# Patient Record
Sex: Male | Born: 1980 | Race: White | Hispanic: No | Smoking: Current every day smoker
Health system: Southern US, Community
[De-identification: ages and names within clinical notes are randomized; demographics above are authoritative.]

## PROBLEM LIST (undated history)

## (undated) DIAGNOSIS — I10 Essential (primary) hypertension: Secondary | ICD-10-CM

## (undated) DIAGNOSIS — E119 Type 2 diabetes mellitus without complications: Secondary | ICD-10-CM

## (undated) DIAGNOSIS — K769 Liver disease, unspecified: Secondary | ICD-10-CM

---

## 2006-10-10 ENCOUNTER — Emergency Department (HOSPITAL_COMMUNITY): Admission: EM | Admit: 2006-10-10 | Discharge: 2006-10-10 | Payer: Self-pay | Admitting: Emergency Medicine

## 2007-01-06 ENCOUNTER — Emergency Department (HOSPITAL_COMMUNITY): Admission: EM | Admit: 2007-01-06 | Discharge: 2007-01-06 | Payer: Self-pay | Admitting: Emergency Medicine

## 2007-06-21 ENCOUNTER — Emergency Department (HOSPITAL_COMMUNITY): Admission: EM | Admit: 2007-06-21 | Discharge: 2007-06-21 | Payer: Self-pay | Admitting: Emergency Medicine

## 2014-11-10 ENCOUNTER — Emergency Department: Payer: Self-pay | Admitting: Emergency Medicine

## 2020-07-22 ENCOUNTER — Ambulatory Visit: Payer: Medicaid Other | Attending: Internal Medicine

## 2020-07-22 DIAGNOSIS — Z23 Encounter for immunization: Secondary | ICD-10-CM

## 2020-07-22 NOTE — Progress Notes (Signed)
   Covid-19 Vaccination Clinic  Name:  Stephen Rangel    MRN: [id number] DOB: 05/20/1981  07/22/2020  [name] was observed post Covid-19 immunization for 15 minutes without incident. He was provided with Vaccine Information Sheet and instruction to access the V-Safe system.   [name] was instructed to call 911 with any severe reactions post vaccine: Marland Kitchen Difficulty breathing  . Swelling of face and throat  . A fast heartbeat  . A bad rash all over body  . Dizziness and weakness   Immunizations Administered    Name Date Dose VIS Date Route   Pfizer COVID-19 Vaccine 07/22/2020 10:29 AM 0.3 mL 01/31/2019 Intramuscular   Manufacturer: [employer], [employer]   Lot: [id number]   NDC: [address]-4

## 2020-08-19 ENCOUNTER — Ambulatory Visit: Payer: Self-pay

## 2021-03-02 ENCOUNTER — Other Ambulatory Visit: Payer: Self-pay

## 2021-03-02 ENCOUNTER — Emergency Department
Admission: EM | Admit: 2021-03-02 | Discharge: 2021-03-02 | Disposition: A | Discharge status: Home / Self Care | Payer: Medicaid Other | Attending: Emergency Medicine | Admitting: Emergency Medicine

## 2021-03-02 ENCOUNTER — Encounter: Payer: Self-pay | Admitting: Emergency Medicine

## 2021-03-02 DIAGNOSIS — X58XXXA Exposure to other specified factors, initial encounter: Secondary | ICD-10-CM | POA: Insufficient documentation

## 2021-03-02 DIAGNOSIS — S025XXA Fracture of tooth (traumatic), initial encounter for closed fracture: Secondary | ICD-10-CM | POA: Insufficient documentation

## 2021-03-02 DIAGNOSIS — K047 Periapical abscess without sinus: Secondary | ICD-10-CM

## 2021-03-02 DIAGNOSIS — E119 Type 2 diabetes mellitus without complications: Secondary | ICD-10-CM | POA: Insufficient documentation

## 2021-03-02 DIAGNOSIS — S0993XA Unspecified injury of face, initial encounter: Secondary | ICD-10-CM | POA: Diagnosis present

## 2021-03-02 DIAGNOSIS — K0889 Other specified disorders of teeth and supporting structures: Secondary | ICD-10-CM

## 2021-03-02 HISTORY — DX: Type 2 diabetes mellitus without complications: E11.9

## 2021-03-02 HISTORY — DX: Liver disease, unspecified: K76.9

## 2021-03-02 MED ORDER — AMOXICILLIN-POT CLAVULANATE 875-125 MG PO TABS: 1.0000 | ORAL_TABLET | Freq: Two times a day (BID) | ORAL | 0 refills | Status: AC

## 2021-03-02 MED ORDER — HYDROCODONE-ACETAMINOPHEN 5-325 MG PO TABS: 1.0000 | ORAL_TABLET | ORAL | 0 refills | Status: DC | PRN

## 2021-03-02 MED ORDER — AMOXICILLIN-POT CLAVULANATE 875-125 MG PO TABS
1.0000 | ORAL_TABLET | Freq: Once | ORAL | Status: AC
  Administered 2021-03-02: 1 via ORAL
  Filled 2021-03-02: qty 1

## 2021-03-02 MED ORDER — HYDROCODONE-ACETAMINOPHEN 5-325 MG PO TABS
1.0000 | ORAL_TABLET | ORAL | Status: AC
  Administered 2021-03-02: 1 via ORAL
  Filled 2021-03-02: qty 1

## 2021-03-02 NOTE — Discharge Instructions (Addendum)
Please take antibiotics as prescribed and follow-up with dental clinic Monday.  Return to the ER for any fevers difficulty swallowing worsening symptoms or urgent changes in your health.

## 2021-03-02 NOTE — ED Triage Notes (Signed)
Pt reports toothache to right lower back jaw for the past year worsening over the last few days

## 2021-03-02 NOTE — ED Provider Notes (Signed)
[facility] EMERGENCY DEPARTMENT Provider Note   CSN: [id number] Arrival date & time: 03/02/21  1008     History Chief Complaint  Patient presents with  . Dental Pain    Stephen Rangel is a 40 y.o. male presents to the emergency department for evaluation of right upper and lower dental pain.  For 1 year he has had 2 broken teeth, his right upper third molar and his right lower second molar.  He has had intermittent infections, most recent infection started several days ago.  He has pain to touch with eating, denies any fevers.  When he breathes then, air will aggravate both of these teeth.  Pain is moderate to severe, no relief with BC powder.  He is not on any current antibiotics.  Patient states he is going to see a dentist Monday for extraction. HPI     Past Medical History:  Diagnosis Date  . Diabetes mellitus without complication (HCC)   . Liver disease     There are no problems to display for this patient.   History reviewed. No pertinent surgical history.     No family history on file.     Home Medications Prior to Admission medications   Medication Sig Start Date End Date Taking? Authorizing Provider  amoxicillin-clavulanate (AUGMENTIN) 875-125 MG tablet Take 1 tablet by mouth every 12 (twelve) hours for 7 days. 03/02/21 03/09/21 Yes Evon Slack, PA-C  HYDROcodone-acetaminophen (NORCO) 5-325 MG tablet Take 1 tablet by mouth every 4 (four) hours as needed for moderate pain. 03/02/21  Yes Evon Slack, PA-C    Allergies    Patient has no allergy information on record.  Review of Systems   Review of Systems  Constitutional: Negative.  Negative for chills and fever.  HENT: Positive for dental problem. Negative for drooling, facial swelling, mouth sores, trouble swallowing and voice change.   Respiratory: Negative for shortness of breath.   Cardiovascular: Negative for chest pain.  Gastrointestinal: Negative for nausea and vomiting.   Musculoskeletal: Negative for arthralgias, neck pain and neck stiffness.  Skin: Negative.   All other systems reviewed and are negative.   Physical Exam Updated Vital Signs BP 136/74 (BP Location: Left Arm)   Pulse 63   Temp 98.6 F (37 C) (Oral)   Resp 16   Ht 5\' 11"  (1.803 m)   Wt 95.7 kg   SpO2 98%   BMI 29.43 kg/m   Physical Exam Constitutional:      General: He is not in acute distress.    Appearance: He is well-developed.  HENT:     Head: Normocephalic and atraumatic.     Jaw: No trismus.     Comments: Right upper third molar and right lower second molar cracked, decayed with tenderness.  No signs of fluctuance or surrounding abscess formation.  No external soft tissue swelling.  No trismus.    Right Ear: External ear normal.     Left Ear: External ear normal.     Nose: Nose normal.     Mouth/Throat:     Mouth: No oral lesions.     Dentition: Normal dentition.     Pharynx: Uvula midline. No oropharyngeal exudate, posterior oropharyngeal erythema or uvula swelling.  Eyes:     Conjunctiva/sclera: Conjunctivae normal.  Cardiovascular:     Rate and Rhythm: Normal rate.     Heart sounds: No murmur heard. No friction rub. No gallop.   Pulmonary:     Effort: Pulmonary effort  is normal. No respiratory distress.     Breath sounds: Normal breath sounds.  Musculoskeletal:     Cervical back: Normal range of motion and neck supple.  Skin:    General: Skin is warm and dry.  Neurological:     General: No focal deficit present.     Mental Status: He is alert and oriented to person, place, and time.  Psychiatric:        Behavior: Behavior normal.        Thought Content: Thought content normal.     ED Results / Procedures / Treatments   Labs (all labs ordered are listed, but only abnormal results are displayed) Labs Reviewed - No data to display  EKG None  Radiology No results found.  Procedures Procedures   Medications Ordered in ED Medications   amoxicillin-clavulanate (AUGMENTIN) 875-125 MG per tablet 1 tablet (has no administration in time range)  HYDROcodone-acetaminophen (NORCO/VICODIN) 5-325 MG per tablet 1 tablet (has no administration in time range)    ED Course  I have reviewed the triage vital signs and the nursing notes.  Pertinent labs & imaging results that were available during my care of the patient were reviewed by me and considered in my medical decision making (see chart for details).    MDM Rules/Calculators/A&P                          40 year old male with upper and lower dental pain secondary to cracked teeth with underlying dental infection.  He is started on oral antibiotic and given Norco for pain.  He understands signs and symptoms return to the ER for.  He states he has an appointment Monday with surgeon to discuss extraction. Final Clinical Impression(s) / ED Diagnoses Final diagnoses:  Pain, dental  Closed fracture of tooth, initial encounter  Dental infection    Rx / DC Orders ED Discharge Orders         Ordered    amoxicillin-clavulanate (AUGMENTIN) 875-125 MG tablet  Every 12 hours        03/02/21 1035    HYDROcodone-acetaminophen (NORCO) 5-325 MG tablet  Every 4 hours PRN        03/02/21 1035           Ronnette Juniper 03/02/21 1039    Sharyn Creamer, MD 03/03/21 1307

## 2021-03-03 ENCOUNTER — Other Ambulatory Visit: Payer: Self-pay

## 2021-03-03 DIAGNOSIS — K0889 Other specified disorders of teeth and supporting structures: Secondary | ICD-10-CM | POA: Diagnosis present

## 2021-03-03 DIAGNOSIS — Z5321 Procedure and treatment not carried out due to patient leaving prior to being seen by health care provider: Secondary | ICD-10-CM | POA: Diagnosis not present

## 2021-03-03 NOTE — ED Triage Notes (Signed)
Patient ambulatory to triage with steady gait, without difficulty or distress noted; st seen recently for dental abscess; c/o persistent rt sided dental pain

## 2021-03-04 ENCOUNTER — Encounter: Payer: Self-pay | Admitting: Emergency Medicine

## 2021-03-04 ENCOUNTER — Emergency Department
Admission: EM | Admit: 2021-03-04 | Discharge: 2021-03-04 | Disposition: A | Discharge status: Home / Self Care | Payer: Medicaid Other | Attending: Emergency Medicine | Admitting: Emergency Medicine

## 2021-03-04 ENCOUNTER — Other Ambulatory Visit: Payer: Self-pay

## 2021-04-05 ENCOUNTER — Emergency Department
Admission: EM | Admit: 2021-04-05 | Discharge: 2021-04-05 | Discharge status: Against Medical Advice | Payer: Medicaid Other | Attending: Emergency Medicine | Admitting: Emergency Medicine

## 2021-04-05 ENCOUNTER — Other Ambulatory Visit: Payer: Self-pay

## 2021-04-05 ENCOUNTER — Emergency Department: Payer: Medicaid Other

## 2021-04-05 DIAGNOSIS — J168 Pneumonia due to other specified infectious organisms: Secondary | ICD-10-CM | POA: Diagnosis not present

## 2021-04-05 DIAGNOSIS — R0602 Shortness of breath: Secondary | ICD-10-CM | POA: Diagnosis present

## 2021-04-05 DIAGNOSIS — Z20822 Contact with and (suspected) exposure to covid-19: Secondary | ICD-10-CM | POA: Diagnosis not present

## 2021-04-05 DIAGNOSIS — E119 Type 2 diabetes mellitus without complications: Secondary | ICD-10-CM | POA: Insufficient documentation

## 2021-04-05 DIAGNOSIS — J45901 Unspecified asthma with (acute) exacerbation: Secondary | ICD-10-CM

## 2021-04-05 DIAGNOSIS — J40 Bronchitis, not specified as acute or chronic: Secondary | ICD-10-CM | POA: Insufficient documentation

## 2021-04-05 DIAGNOSIS — R062 Wheezing: Secondary | ICD-10-CM | POA: Diagnosis not present

## 2021-04-05 DIAGNOSIS — F17200 Nicotine dependence, unspecified, uncomplicated: Secondary | ICD-10-CM | POA: Insufficient documentation

## 2021-04-05 DIAGNOSIS — J189 Pneumonia, unspecified organism: Secondary | ICD-10-CM

## 2021-04-05 LAB — COMPREHENSIVE METABOLIC PANEL
ALT: 41 U/L (ref 0–44)
AST: 37 U/L (ref 15–41)
Albumin: 3.6 g/dL (ref 3.5–5.0)
Alkaline Phosphatase: 83 U/L (ref 38–126)
Anion gap: 9 (ref 5–15)
BUN: 16 mg/dL (ref 6–20)
CO2: 25 mmol/L (ref 22–32)
Calcium: 8.4 mg/dL — ABNORMAL LOW (ref 8.9–10.3)
Chloride: 101 mmol/L (ref 98–111)
Creatinine, Ser: 0.71 mg/dL (ref 0.61–1.24)
GFR, Estimated: >60 mL/min (ref >60)
Glucose, Bld: 129 mg/dL — ABNORMAL HIGH (ref 70–99)
Potassium: 4.4 mmol/L (ref 3.5–5.1)
Sodium: 135 mmol/L (ref 135–145)
Total Bilirubin: 0.6 mg/dL (ref 0.3–1.2)
Total Protein: 7.5 g/dL (ref 6.5–8.1)

## 2021-04-05 LAB — CBC WITH DIFFERENTIAL/PLATELET
Abs Immature Granulocytes: 0.02 10*3/uL (ref 0.00–0.07)
Basophils Absolute: 0 10*3/uL (ref 0.0–0.1)
Basophils Relative: 0 %
Eosinophils Absolute: 0.1 10*3/uL (ref 0.0–0.5)
Eosinophils Relative: 1 %
HCT: 40.2 % (ref 39.0–52.0)
Hemoglobin: 13 g/dL (ref 13.0–17.0)
Immature Granulocytes: 0 %
Lymphocytes Relative: 17 %
Lymphs Abs: 1.7 10*3/uL (ref 0.7–4.0)
MCH: 27.9 pg (ref 26.0–34.0)
MCHC: 32.3 g/dL (ref 30.0–36.0)
MCV: 86.3 fL (ref 80.0–100.0)
Monocytes Absolute: 0.8 10*3/uL (ref 0.1–1.0)
Monocytes Relative: 8 %
Neutro Abs: 7.2 10*3/uL (ref 1.7–7.7)
Neutrophils Relative %: 74 %
Platelets: 156 10*3/uL (ref 150–400)
RBC: 4.66 MIL/uL (ref 4.22–5.81)
RDW: 14.2 % (ref 11.5–15.5)
WBC: 9.9 10*3/uL (ref 4.0–10.5)
nRBC: 0 % (ref 0.0–0.2)

## 2021-04-05 LAB — PROCALCITONIN: Procalcitonin: <0.1 ng/mL

## 2021-04-05 LAB — RESP PANEL BY RT-PCR (FLU A&B, COVID) ARPGX2
Influenza A by PCR: NEGATIVE
Influenza B by PCR: NEGATIVE
SARS Coronavirus 2 by RT PCR: NEGATIVE

## 2021-04-05 MED ORDER — DOXYCYCLINE HYCLATE 100 MG PO CAPS: 100.0000 mg | ORAL_CAPSULE | Freq: Two times a day (BID) | ORAL | 0 refills | Status: DC

## 2021-04-05 MED ORDER — IPRATROPIUM-ALBUTEROL 0.5-2.5 (3) MG/3ML IN SOLN
9.0000 mL | Freq: Once | RESPIRATORY_TRACT | Status: AC
  Administered 2021-04-05: 9 mL via RESPIRATORY_TRACT
  Filled 2021-04-05: qty 9

## 2021-04-05 MED ORDER — ALBUTEROL SULFATE HFA 108 (90 BASE) MCG/ACT IN AERS: 2.0000 | INHALATION_SPRAY | Freq: Four times a day (QID) | RESPIRATORY_TRACT | 0 refills | Status: AC | PRN

## 2021-04-05 MED ORDER — METHYLPREDNISOLONE SODIUM SUCC 125 MG IJ SOLR
125.0000 mg | Freq: Once | INTRAMUSCULAR | Status: AC
  Administered 2021-04-05: 125 mg via INTRAVENOUS
  Filled 2021-04-05: qty 2

## 2021-04-05 MED ORDER — PREDNISONE 10 MG PO TABS: 40.0000 mg | ORAL_TABLET | Freq: Every day | ORAL | 0 refills | Status: DC

## 2021-04-05 NOTE — ED Triage Notes (Signed)
Pt to ER via POV with complaints of shortness of breath x4 days. Reports it has progressively worsened over that time, cough is productive. Reports low grade fever yesterday.  Unknown if covid contact.

## 2021-04-05 NOTE — ED Provider Notes (Signed)
[facility] Emergency Department Provider Note  ____________________________________________   Event Date/Time   First MD Initiated Contact with Patient 04/05/21 1349     (approximate)  I have reviewed the triage vital signs and the nursing notes.   HISTORY  Chief Complaint Shortness of Breath   HPI Stephen Rangel is a 40 y.o. male with a past medical history of DM and asthma who presents for approximately 4 days of shortness of breath and cough as well as myalgias.  He denies any specific chest pain, abdominal pain, headache, earache, sore throat, nausea, vomiting, diarrhea, dysuria, rash, fevers or any other acute complaints.  States he is out of all his medicines including albuterol.  Does endorse tobacco abuse.  No recent injuries or falls.  No recent illicit drug use.  No rashes or extremity pain.  No other acute concerns at this time.         Past Medical History:  Diagnosis Date  . Diabetes mellitus without complication ([place])   . Liver disease     There are no problems to display for this patient.   History reviewed. No pertinent surgical history.  Prior to Admission medications   Medication Sig Start Date End Date Taking? Authorizing Provider  albuterol (VENTOLIN HFA) 108 (90 Base) MCG/ACT inhaler Inhale 2 puffs into the lungs every 6 (six) hours as needed for up to 1 dose for wheezing or shortness of breath. 04/05/21  Yes Lucrezia Starch, MD  doxycycline (VIBRAMYCIN) 100 MG capsule Take 1 capsule (100 mg total) by mouth 2 (two) times daily for 10 days. 04/05/21 04/15/21 Yes Lucrezia Starch, MD  predniSONE (DELTASONE) 10 MG tablet Take 4 tablets (40 mg total) by mouth daily for 4 days. 04/05/21 04/09/21 Yes Lucrezia Starch, MD    Allergies Patient has no known allergies.  No family history on file.  Social History Social History   Tobacco Use  . Smoking status: Current Every Day Smoker  . Smokeless tobacco: Never Used  Vaping Use  .  Vaping Use: Never used    Review of Systems  Review of Systems  Constitutional: Positive for malaise/fatigue. Negative for chills and fever.  HENT: Negative for sore throat.   Eyes: Negative for pain.  Respiratory: Positive for cough and shortness of breath. Negative for stridor.   Cardiovascular: Negative for chest pain.  Gastrointestinal: Negative for vomiting.  Genitourinary: Negative for dysuria.  Musculoskeletal: Positive for myalgias.  Skin: Negative for rash.  Neurological: Negative for seizures, loss of consciousness and headaches.  Psychiatric/Behavioral: Negative for suicidal ideas.  All other systems reviewed and are negative.     ____________________________________________   PHYSICAL EXAM:  VITAL SIGNS: ED Triage Vitals  Enc Vitals Group     BP 04/05/21 1232 135/75     Pulse Rate 04/05/21 1232 86     Resp 04/05/21 1232 18     Temp 04/05/21 1232 98.6 F (37 C)     Temp Source 04/05/21 1232 Oral     SpO2 04/05/21 1232 96 %     Weight 04/05/21 1239 300 lb (136.1 kg)     Height 04/05/21 1239 _0  (1.803 m)     Head Circumference --      Peak Flow --      Pain Score 04/05/21 1237 4     Pain Loc --      Pain Edu? --      Excl. in [place]? --    Vitals:  04/05/21 1400 04/05/21 1430  BP: 127/71 124/71  Pulse: 91 91  Resp: 15 13  Temp:    SpO2: 94% 96%   Physical Exam Vitals and nursing note reviewed.  Constitutional:      Appearance: He is well-developed. He is obese.  HENT:     Head: Normocephalic and atraumatic.     Right Ear: External ear normal.     Left Ear: External ear normal.     Nose: Nose normal.  Eyes:     Conjunctiva/sclera: Conjunctivae normal.  Cardiovascular:     Rate and Rhythm: Normal rate and regular rhythm.     Pulses: Normal pulses.     Heart sounds: No murmur heard.   Pulmonary:     Effort: Tachypnea and respiratory distress present.     Breath sounds: Decreased breath sounds and wheezing present.  Abdominal:      Palpations: Abdomen is soft.     Tenderness: There is no abdominal tenderness.  Musculoskeletal:     Cervical back: Neck supple.  Skin:    General: Skin is warm and dry.     Capillary Refill: Capillary refill takes less than 2 seconds.  Neurological:     Mental Status: He is alert and oriented to person, place, and time.  Psychiatric:        Mood and Affect: Mood normal.      ____________________________________________   LABS (all labs ordered are listed, but only abnormal results are displayed)  Labs Reviewed  COMPREHENSIVE METABOLIC PANEL - Abnormal; Notable for the following components:      Result Value   Glucose, Bld 129 (*)    Calcium 8.4 (*)    All other components within normal limits  RESP PANEL BY RT-PCR (FLU A&B, COVID) ARPGX2  CBC WITH DIFFERENTIAL/PLATELET  PROCALCITONIN   ____________________________________________  EKG  Sinus rhythm with a ventricular of 92, normal axis, unremarkable intervals and nonspecific change in lead III without any other causes of acute ischemia or significant underlying arrhythmia. ____________________________________________  RADIOLOGY  ED MD interpretation: Bilateral patchy opacities consistent with bilateral to focal pneumonia without pneumothorax, large effusion, significant edema or any other clear acute intrathoracic process.  Official radiology report(s): DG Chest 2 View  Result Date: 04/05/2021 CLINICAL DATA:  Shortness of breath.  Productive cough. EXAM: CHEST - 2 VIEW COMPARISON:  March 19, 2019 FINDINGS: Patchy bilateral pulmonary infiltrates, particularly in the bases. The heart, hila, mediastinum, lungs, and pleura are otherwise unremarkable. Surgical hardware, pedicle rods and screws, are seen in the thoracolumbar spine. IMPRESSION: 1. Bi basilar patchy infiltrates most consistent with multifocal pneumonia. Recommend short-term follow-up imaging to ensure resolution. Electronically Signed   By: Dorise Bullion III M.D    On: 04/05/2021 13:22    ____________________________________________   PROCEDURES  Procedure(s) performed (including Critical Care):  .1-3 Lead EKG Interpretation Performed by: Lucrezia Starch, MD Authorized by: Lucrezia Starch, MD     Interpretation: normal     ECG rate assessment: normal     Rhythm: sinus rhythm     Ectopy: none     Conduction: normal       ____________________________________________   INITIAL IMPRESSION / ASSESSMENT AND PLAN / ED COURSE      Patient presents with above to history exam for assessment of approximately 4 days of shortness of breath cough and myalgias.  He states he has been out of his albuterol inhalers.  On arrival he is tachypneic on my exam with wheezing throughout all lung  fields and is diminished.  On trial ambulation he also desats to 90% on room air.  Suspect asthma exacerbation with underlying pneumonia with bilateral Pacitti's on x-ray.  No evidence of pneumothorax or acute volume overload i.e. heart failure on chest x-ray or exam.  Patient has no pain and otherwise given reassuring EKG Evalose patient for ACS.  Given clear wheezing and other associated symptoms including myalgias higher suspicion for viral infection of the lower suspicion for PE or other immediate life-threatening process at this time.  CBC shows no leukocytosis or acute anemia.  Pro-Cal is undetectable.  COVID and flu is negative.  CMP shows no significant electrolyte or metabolic derangements.  Patient treated with duo nebs and Solu-Medrol.  On reassessment patient stated he felt better but his SPO2 did not improve.  He stated he wished to adamantly go home understanding that his oxygen can could get worse after explained my concerns that he could become more hypoxic and that he was already borderline and likely would need additional treatments of MAC several hours.  Patient stated he understood this but still wished to leave.  I think he had capacity to make this  decision he was discharged against my advice.  Rx written for refills for his albuterol and he was given a prescription for Doxy and prednisone.  Instructed to follow-up with PCP.  Discharged in stable condition.       ____________________________________________   FINAL CLINICAL IMPRESSION(S) / ED DIAGNOSES  Final diagnoses:  Bronchitis  Severe asthma with exacerbation, unspecified whether persistent  Pneumonia due to infectious organism, unspecified laterality, unspecified part of lung    Medications  methylPREDNISolone sodium succinate (SOLU-MEDROL) 125 mg/2 mL injection 125 mg (125 mg Intravenous Given 04/05/21 1429)  ipratropium-albuterol (DUONEB) 0.5-2.5 (3) MG/3ML nebulizer solution 9 mL (9 mLs Nebulization Given 04/05/21 1428)     ED Discharge Orders         Ordered    albuterol (VENTOLIN HFA) 108 (90 Base) MCG/ACT inhaler  Every 6 hours PRN        04/05/21 1524    predniSONE (DELTASONE) 10 MG tablet  Daily        04/05/21 1524    doxycycline (VIBRAMYCIN) 100 MG capsule  2 times daily        04/05/21 1524           Note:  This document was prepared using Dragon voice recognition software and may include unintentional dictation errors.   Lucrezia Starch, MD 04/05/21 [id number]

## 2021-04-06 ENCOUNTER — Inpatient Hospital Stay
Admission: EM | Admit: 2021-04-06 | Discharge: 2021-04-09 | DRG: 193 | Disposition: A | Discharge status: Home / Self Care | Payer: Medicaid Other | Attending: Internal Medicine | Admitting: Internal Medicine

## 2021-04-06 DIAGNOSIS — E1140 Type 2 diabetes mellitus with diabetic neuropathy, unspecified: Secondary | ICD-10-CM | POA: Diagnosis present

## 2021-04-06 DIAGNOSIS — J45901 Unspecified asthma with (acute) exacerbation: Secondary | ICD-10-CM

## 2021-04-06 DIAGNOSIS — Z9114 Patient's other noncompliance with medication regimen: Secondary | ICD-10-CM

## 2021-04-06 DIAGNOSIS — J189 Pneumonia, unspecified organism: Secondary | ICD-10-CM

## 2021-04-06 DIAGNOSIS — J45909 Unspecified asthma, uncomplicated: Secondary | ICD-10-CM | POA: Diagnosis present

## 2021-04-06 DIAGNOSIS — Z20822 Contact with and (suspected) exposure to covid-19: Secondary | ICD-10-CM | POA: Diagnosis present

## 2021-04-06 DIAGNOSIS — F1210 Cannabis abuse, uncomplicated: Secondary | ICD-10-CM | POA: Diagnosis present

## 2021-04-06 DIAGNOSIS — J9601 Acute respiratory failure with hypoxia: Secondary | ICD-10-CM | POA: Diagnosis present

## 2021-04-06 DIAGNOSIS — F1120 Opioid dependence, uncomplicated: Secondary | ICD-10-CM

## 2021-04-06 DIAGNOSIS — Z6841 Body Mass Index (BMI) 40.0 and over, adult: Secondary | ICD-10-CM

## 2021-04-06 DIAGNOSIS — F17200 Nicotine dependence, unspecified, uncomplicated: Secondary | ICD-10-CM | POA: Diagnosis present

## 2021-04-06 DIAGNOSIS — E119 Type 2 diabetes mellitus without complications: Principal | ICD-10-CM

## 2021-04-06 DIAGNOSIS — K769 Liver disease, unspecified: Secondary | ICD-10-CM | POA: Diagnosis present

## 2021-04-06 DIAGNOSIS — B182 Chronic viral hepatitis C: Secondary | ICD-10-CM | POA: Diagnosis present

## 2021-04-06 DIAGNOSIS — J432 Centrilobular emphysema: Secondary | ICD-10-CM | POA: Diagnosis present

## 2021-04-06 DIAGNOSIS — F1110 Opioid abuse, uncomplicated: Secondary | ICD-10-CM | POA: Diagnosis present

## 2021-04-06 DIAGNOSIS — J1289 Other viral pneumonia: Principal | ICD-10-CM | POA: Diagnosis present

## 2021-04-06 DIAGNOSIS — Z79899 Other long term (current) drug therapy: Secondary | ICD-10-CM

## 2021-04-06 DIAGNOSIS — F502 Bulimia nervosa: Secondary | ICD-10-CM | POA: Diagnosis present

## 2021-04-07 ENCOUNTER — Emergency Department: Payer: Medicaid Other

## 2021-04-07 ENCOUNTER — Other Ambulatory Visit: Payer: Self-pay

## 2021-04-07 ENCOUNTER — Encounter: Payer: Self-pay | Admitting: Internal Medicine

## 2021-04-07 DIAGNOSIS — J189 Pneumonia, unspecified organism: Secondary | ICD-10-CM

## 2021-04-07 DIAGNOSIS — F1110 Opioid abuse, uncomplicated: Secondary | ICD-10-CM | POA: Diagnosis present

## 2021-04-07 DIAGNOSIS — E1140 Type 2 diabetes mellitus with diabetic neuropathy, unspecified: Secondary | ICD-10-CM | POA: Diagnosis present

## 2021-04-07 DIAGNOSIS — F1120 Opioid dependence, uncomplicated: Secondary | ICD-10-CM

## 2021-04-07 DIAGNOSIS — Z9114 Patient's other noncompliance with medication regimen: Secondary | ICD-10-CM | POA: Diagnosis not present

## 2021-04-07 DIAGNOSIS — J45901 Unspecified asthma with (acute) exacerbation: Secondary | ICD-10-CM

## 2021-04-07 DIAGNOSIS — F1210 Cannabis abuse, uncomplicated: Secondary | ICD-10-CM | POA: Diagnosis present

## 2021-04-07 DIAGNOSIS — K769 Liver disease, unspecified: Secondary | ICD-10-CM | POA: Diagnosis present

## 2021-04-07 DIAGNOSIS — F502 Bulimia nervosa: Secondary | ICD-10-CM | POA: Diagnosis present

## 2021-04-07 DIAGNOSIS — Z20822 Contact with and (suspected) exposure to covid-19: Secondary | ICD-10-CM | POA: Diagnosis present

## 2021-04-07 DIAGNOSIS — F17200 Nicotine dependence, unspecified, uncomplicated: Secondary | ICD-10-CM | POA: Diagnosis present

## 2021-04-07 DIAGNOSIS — J9601 Acute respiratory failure with hypoxia: Secondary | ICD-10-CM | POA: Diagnosis present

## 2021-04-07 DIAGNOSIS — E119 Type 2 diabetes mellitus without complications: Secondary | ICD-10-CM

## 2021-04-07 DIAGNOSIS — Z6841 Body Mass Index (BMI) 40.0 and over, adult: Secondary | ICD-10-CM | POA: Diagnosis not present

## 2021-04-07 DIAGNOSIS — J432 Centrilobular emphysema: Secondary | ICD-10-CM | POA: Diagnosis present

## 2021-04-07 DIAGNOSIS — J1289 Other viral pneumonia: Secondary | ICD-10-CM | POA: Diagnosis not present

## 2021-04-07 DIAGNOSIS — B182 Chronic viral hepatitis C: Secondary | ICD-10-CM | POA: Diagnosis present

## 2021-04-07 DIAGNOSIS — Z79899 Other long term (current) drug therapy: Secondary | ICD-10-CM | POA: Diagnosis not present

## 2021-04-07 DIAGNOSIS — J45909 Unspecified asthma, uncomplicated: Secondary | ICD-10-CM | POA: Diagnosis present

## 2021-04-07 LAB — RESP PANEL BY RT-PCR (FLU A&B, COVID) ARPGX2
Influenza A by PCR: NEGATIVE
Influenza B by PCR: NEGATIVE
SARS Coronavirus 2 by RT PCR: NEGATIVE

## 2021-04-07 LAB — COMPREHENSIVE METABOLIC PANEL
ALT: 39 U/L (ref 0–44)
AST: 38 U/L (ref 15–41)
Albumin: 3.3 g/dL — ABNORMAL LOW (ref 3.5–5.0)
Alkaline Phosphatase: 84 U/L (ref 38–126)
Anion gap: 8 (ref 5–15)
BUN: 25 mg/dL — ABNORMAL HIGH (ref 6–20)
CO2: 24 mmol/L (ref 22–32)
Calcium: 8.4 mg/dL — ABNORMAL LOW (ref 8.9–10.3)
Chloride: 104 mmol/L (ref 98–111)
Creatinine, Ser: 0.52 mg/dL — ABNORMAL LOW (ref 0.61–1.24)
GFR, Estimated: >60 mL/min (ref >60)
Glucose, Bld: 223 mg/dL — ABNORMAL HIGH (ref 70–99)
Potassium: 4 mmol/L (ref 3.5–5.1)
Sodium: 136 mmol/L (ref 135–145)
Total Bilirubin: 0.5 mg/dL (ref 0.3–1.2)
Total Protein: 7.2 g/dL (ref 6.5–8.1)

## 2021-04-07 LAB — CBC WITH DIFFERENTIAL/PLATELET
Abs Immature Granulocytes: 0.07 10*3/uL (ref 0.00–0.07)
Basophils Absolute: 0 10*3/uL (ref 0.0–0.1)
Basophils Relative: 0 %
Eosinophils Absolute: 0 10*3/uL (ref 0.0–0.5)
Eosinophils Relative: 0 %
HCT: 39.2 % (ref 39.0–52.0)
Hemoglobin: 13.1 g/dL (ref 13.0–17.0)
Immature Granulocytes: 0 %
Lymphocytes Relative: 12 %
Lymphs Abs: 2 10*3/uL (ref 0.7–4.0)
MCH: 28.5 pg (ref 26.0–34.0)
MCHC: 33.4 g/dL (ref 30.0–36.0)
MCV: 85.2 fL (ref 80.0–100.0)
Monocytes Absolute: 0.8 10*3/uL (ref 0.1–1.0)
Monocytes Relative: 5 %
Neutro Abs: 13.9 10*3/uL — ABNORMAL HIGH (ref 1.7–7.7)
Neutrophils Relative %: 83 %
Platelets: 176 10*3/uL (ref 150–400)
RBC: 4.6 MIL/uL (ref 4.22–5.81)
RDW: 14.4 % (ref 11.5–15.5)
WBC: 16.8 10*3/uL — ABNORMAL HIGH (ref 4.0–10.5)
nRBC: 0 % (ref 0.0–0.2)

## 2021-04-07 LAB — D-DIMER, QUANTITATIVE: D-Dimer, Quant: 0.83 ug/mL-FEU — ABNORMAL HIGH (ref 0.00–0.50)

## 2021-04-07 LAB — URINE DRUG SCREEN, QUALITATIVE (ARMC ONLY)
Amphetamines, Ur Screen: NOT DETECTED
Barbiturates, Ur Screen: NOT DETECTED
Benzodiazepine, Ur Scrn: NOT DETECTED
Cannabinoid 50 Ng, Ur ~~LOC~~: POSITIVE — AB
Cocaine Metabolite,Ur ~~LOC~~: NOT DETECTED
MDMA (Ecstasy)Ur Screen: NOT DETECTED
Methadone Scn, Ur: POSITIVE — AB
Opiate, Ur Screen: NOT DETECTED
Phencyclidine (PCP) Ur S: NOT DETECTED
Tricyclic, Ur Screen: NOT DETECTED

## 2021-04-07 LAB — TROPONIN I (HIGH SENSITIVITY): Troponin I (High Sensitivity): 5 ng/L (ref <18)

## 2021-04-07 LAB — HIV ANTIBODY (ROUTINE TESTING W REFLEX): HIV Screen 4th Generation wRfx: NONREACTIVE

## 2021-04-07 LAB — GLUCOSE, CAPILLARY
Glucose-Capillary: 282 mg/dL — ABNORMAL HIGH (ref 70–99)
Glucose-Capillary: 299 mg/dL — ABNORMAL HIGH (ref 70–99)
Glucose-Capillary: 215 mg/dL — ABNORMAL HIGH (ref 70–99)
Glucose-Capillary: 167 mg/dL — ABNORMAL HIGH (ref 70–99)

## 2021-04-07 LAB — HEMOGLOBIN A1C
Hgb A1c MFr Bld: 6.1 % — ABNORMAL HIGH (ref 4.8–5.6)
Mean Plasma Glucose: 128.37 mg/dL

## 2021-04-07 LAB — PROCALCITONIN: Procalcitonin: <0.1 ng/mL

## 2021-04-07 LAB — BRAIN NATRIURETIC PEPTIDE: B Natriuretic Peptide: 61.7 pg/mL (ref 0.0–100.0)

## 2021-04-07 MED ORDER — METHYLPREDNISOLONE SODIUM SUCC 125 MG IJ SOLR
125.0000 mg | Freq: Once | INTRAMUSCULAR | Status: AC
  Administered 2021-04-07: 125 mg via INTRAVENOUS
  Filled 2021-04-07: qty 2

## 2021-04-07 MED ORDER — METHYLPREDNISOLONE SODIUM SUCC 125 MG IJ SOLR
60.0000 mg | Freq: Four times a day (QID) | INTRAMUSCULAR | Status: DC
  Administered 2021-04-07: 60 mg via INTRAVENOUS
  Filled 2021-04-07: qty 2

## 2021-04-07 MED ORDER — IOHEXOL 350 MG/ML SOLN
100.0000 mL | Freq: Once | INTRAVENOUS | Status: AC | PRN
  Administered 2021-04-07: 100 mL via INTRAVENOUS

## 2021-04-07 MED ORDER — INSULIN ASPART PROT & ASPART (70-30 MIX) 100 UNIT/ML ~~LOC~~ SUSP
20.0000 [IU] | Freq: Two times a day (BID) | SUBCUTANEOUS | Status: DC
  Administered 2021-04-08 – 2021-04-09 (×3): 20 [IU] via SUBCUTANEOUS
  Filled 2021-04-07 (×3): qty 10

## 2021-04-07 MED ORDER — ACETAMINOPHEN 325 MG PO TABS
650.0000 mg | ORAL_TABLET | ORAL | Status: DC | PRN
  Administered 2021-04-07 – 2021-04-09 (×4): 650 mg via ORAL
  Filled 2021-04-07 (×4): qty 2

## 2021-04-07 MED ORDER — IPRATROPIUM-ALBUTEROL 0.5-2.5 (3) MG/3ML IN SOLN
3.0000 mL | Freq: Four times a day (QID) | RESPIRATORY_TRACT | Status: DC
  Administered 2021-04-07 – 2021-04-09 (×8): 3 mL via RESPIRATORY_TRACT
  Filled 2021-04-07 (×8): qty 3

## 2021-04-07 MED ORDER — ALBUTEROL SULFATE HFA 108 (90 BASE) MCG/ACT IN AERS
1.0000 | INHALATION_SPRAY | RESPIRATORY_TRACT | Status: DC | PRN
  Administered 2021-04-07 – 2021-04-09 (×4): 2 via RESPIRATORY_TRACT
  Filled 2021-04-07 (×2): qty 6.7

## 2021-04-07 MED ORDER — METHADONE HCL 10 MG/ML PO CONC
118.0000 mg | Freq: Every day | ORAL | Status: DC
  Administered 2021-04-07 – 2021-04-09 (×3): 118 mg via ORAL
  Filled 2021-04-07 (×3): qty 11.8

## 2021-04-07 MED ORDER — ENOXAPARIN SODIUM 40 MG/0.4ML IJ SOSY: 40.0000 mg | PREFILLED_SYRINGE | INTRAMUSCULAR | Status: DC

## 2021-04-07 MED ORDER — INSULIN ASPART 100 UNIT/ML IJ SOLN
0.0000 [IU] | Freq: Every day | INTRAMUSCULAR | Status: DC
  Filled 2021-04-07: qty 1

## 2021-04-07 MED ORDER — IPRATROPIUM-ALBUTEROL 0.5-2.5 (3) MG/3ML IN SOLN
3.0000 mL | Freq: Once | RESPIRATORY_TRACT | Status: AC
  Administered 2021-04-07: 3 mL via RESPIRATORY_TRACT
  Filled 2021-04-07: qty 3

## 2021-04-07 MED ORDER — AZITHROMYCIN 250 MG PO TABS
500.0000 mg | ORAL_TABLET | Freq: Every day | ORAL | Status: DC
  Administered 2021-04-08: 500 mg via ORAL
  Filled 2021-04-07: qty 2

## 2021-04-07 MED ORDER — MAGNESIUM SULFATE 2 GM/50ML IV SOLN
2.0000 g | Freq: Once | INTRAVENOUS | Status: AC
  Administered 2021-04-07: 2 g via INTRAVENOUS
  Filled 2021-04-07: qty 50

## 2021-04-07 MED ORDER — GUAIFENESIN ER 600 MG PO TB12
600.0000 mg | ORAL_TABLET | Freq: Two times a day (BID) | ORAL | Status: DC
  Administered 2021-04-07 – 2021-04-09 (×5): 600 mg via ORAL
  Filled 2021-04-07 (×5): qty 1

## 2021-04-07 MED ORDER — ENOXAPARIN SODIUM 80 MG/0.8ML IJ SOSY
0.5000 mg/kg | PREFILLED_SYRINGE | INTRAMUSCULAR | Status: DC
  Administered 2021-04-07 – 2021-04-09 (×3): 67.5 mg via SUBCUTANEOUS
  Filled 2021-04-07 (×3): qty 0.68

## 2021-04-07 MED ORDER — ALBUTEROL SULFATE (2.5 MG/3ML) 0.083% IN NEBU
2.5000 mg | INHALATION_SOLUTION | RESPIRATORY_TRACT | Status: DC | PRN
  Administered 2021-04-07 – 2021-04-08 (×2): 2.5 mg via RESPIRATORY_TRACT
  Filled 2021-04-07 (×2): qty 3

## 2021-04-07 MED ORDER — PREDNISONE 20 MG PO TABS: 50.0000 mg | ORAL_TABLET | Freq: Every day | ORAL | Status: DC

## 2021-04-07 MED ORDER — SODIUM CHLORIDE 0.9 % IV SOLN
1.0000 g | Freq: Once | INTRAVENOUS | Status: AC
  Administered 2021-04-07: 1 g via INTRAVENOUS
  Filled 2021-04-07: qty 10

## 2021-04-07 MED ORDER — ENSURE MAX PROTEIN PO LIQD
11.0000 [oz_av] | Freq: Two times a day (BID) | ORAL | Status: DC
  Administered 2021-04-07 – 2021-04-09 (×4): 11 [oz_av] via ORAL
  Filled 2021-04-07: qty 330

## 2021-04-07 MED ORDER — AZITHROMYCIN 500 MG PO TABS
500.0000 mg | ORAL_TABLET | Freq: Once | ORAL | Status: AC
  Administered 2021-04-07: 500 mg via ORAL
  Filled 2021-04-07: qty 1

## 2021-04-07 MED ORDER — SODIUM CHLORIDE 0.9 % IV SOLN
1.0000 g | INTRAVENOUS | Status: DC
  Filled 2021-04-07 (×2): qty 10

## 2021-04-07 MED ORDER — PREDNISONE 20 MG PO TABS: 40.0000 mg | ORAL_TABLET | Freq: Every day | ORAL | Status: DC

## 2021-04-07 MED ORDER — INSULIN ASPART 100 UNIT/ML IJ SOLN
0.0000 [IU] | Freq: Three times a day (TID) | INTRAMUSCULAR | Status: DC
  Administered 2021-04-07: 11 [IU] via SUBCUTANEOUS
  Administered 2021-04-07: 7 [IU] via SUBCUTANEOUS
  Administered 2021-04-07: 11 [IU] via SUBCUTANEOUS
  Administered 2021-04-08: 7 [IU] via SUBCUTANEOUS
  Administered 2021-04-08: 15 [IU] via SUBCUTANEOUS
  Administered 2021-04-09 (×2): 3 [IU] via SUBCUTANEOUS
  Filled 2021-04-07 (×7): qty 1

## 2021-04-07 NOTE — ED Notes (Addendum)
Pt ambulated in room on 2 L via [place] O2 noted to be 90%-94%. Pt's respirations continue to be labored and Pt c/o SHOB with exertion.

## 2021-04-07 NOTE — ED Notes (Signed)
Pt given a cup of ice water

## 2021-04-07 NOTE — Progress Notes (Signed)
[facility]  - [place] at [facility]   PATIENT NAME: Stephen Rangel    MR#:  [id number]  DATE OF BIRTH:  14-Sep-1981  SUBJECTIVE:   Patient came in with increasing shortness of breath and productive phlegm for last few days. Denies any recent travel or any recent illness. Started with some cold symptoms progress to increasing shortness of breath. Feels a lot better today. REVIEW OF SYSTEMS:   Review of Systems  Constitutional: Negative for chills, fever and weight loss.  HENT: Negative for ear discharge, ear pain and nosebleeds.   Eyes: Negative for blurred vision, pain and discharge.  Respiratory: Positive for shortness of breath. Negative for sputum production, wheezing and stridor.   Cardiovascular: Negative for chest pain, palpitations, orthopnea and PND.  Gastrointestinal: Negative for abdominal pain, diarrhea, nausea and vomiting.  Genitourinary: Negative for frequency and urgency.  Musculoskeletal: Negative for back pain and joint pain.  Neurological: Negative for sensory change, speech change, focal weakness and weakness.  Psychiatric/Behavioral: Negative for depression and hallucinations. The patient is not nervous/anxious.    Tolerating Diet:yes Tolerating PT: not needed  DRUG ALLERGIES:  No Known Allergies  VITALS:  Blood pressure 136/69, pulse 100, temperature 98.6 F (37 C), temperature source Oral, resp. rate 18, height 5\' 11"  (1.803 m), weight 136.1 kg, SpO2 94 %.  PHYSICAL EXAMINATION:   Physical Exam  GENERAL:  40 y.o.-year-old patient lying in the bed with no acute distress. Obese LUNGS: Normal breath sounds bilaterally, no wheezing, rales, rhonchi. No use of accessory muscles of respiration.  CARDIOVASCULAR: S1, S2 normal. No murmurs, rubs, or gallops.  ABDOMEN: Soft, nontender, nondistended. Bowel sounds present. No organomegaly or mass.  EXTREMITIES: No cyanosis, clubbing or edema b/l.    NEUROLOGIC: Cranial nerves II through XII are intact. No  focal Motor or sensory deficits b/l.   PSYCHIATRIC:  patient is alert and oriented x 3.  SKIN: multiple skin tattoos. No rashes  LABORATORY PANEL:  CBC Recent Labs  Lab 04/06/21 0005  WBC 16.8*  HGB 13.1  HCT 39.2  PLT 176    Chemistries  Recent Labs  Lab 04/06/21 0005  NA 136  K 4.0  CL 104  CO2 24  GLUCOSE 223*  BUN 25*  CREATININE 0.52*  CALCIUM 8.4*  AST 38  ALT 39  ALKPHOS 84  BILITOT 0.5   Cardiac Enzymes No results for input(s): TROPONINI in the last 168 hours. RADIOLOGY:  CT Angio Chest PE W and/or Wo Contrast  Result Date: 04/07/2021 CLINICAL DATA:  PE suspected, low to intermediate probability, positive D-dimer EXAM: CT ANGIOGRAPHY CHEST WITH CONTRAST TECHNIQUE: Multidetector CT imaging of the chest was performed using the standard protocol during bolus administration of intravenous contrast. Multiplanar CT image reconstructions and MIPs were obtained to evaluate the vascular anatomy. CONTRAST:  06/07/2021 OMNIPAQUE IOHEXOL 350 MG/ML SOLN COMPARISON:  Radiographs 04/07/2021, 04/05/2021 FINDINGS: Cardiovascular: Borderline opacification of the pulmonary arteries. No central, lobar or proximal segmental filling defects are convincingly demonstrated. Top-normal heart size. No pericardial effusion. Coronary artery calcifications are present. Atherosclerotic plaque within the normal caliber aorta. No acute luminal abnormality of the imaged aorta. No periaortic stranding or hemorrhage. Normal 3 vessel branching of the aortic arch. Proximal great vessels are unremarkable. No major venous abnormalities or significant venous reflux. Mediastinum/Nodes: Scattered borderline enlarged mediastinal and hilar nodes are present for instance a 13 mm right paratracheal node (5/96), a 14 mm subcarinal node (5/123), and a 14 mm right hilar node (5/119). No worrisome axillary  adenopathy. Normal thyroid and thoracic inlet. No acute abnormality of the trachea or esophagus. Lungs/Pleura: Geographic  regions of multifocal mixed ground-glass and consolidative opacity throughout both lungs. Findings on a background of mild paraseptal and centrilobular emphysematous change. Diffuse airways thickening and scattered secretions could reflect chronic bronchitic change or more acute airways inflammation. No pneumothorax or visible pleural effusion. No concerning pulmonary nodules or masses are seen within the limitations of underlying parenchymal disease Upper Abdomen: No acute abnormalities present in the visualized portions of the upper abdomen. Musculoskeletal: Multilevel degenerative changes are present in the imaged portions of the spine. Exaggerated thoracic kyphosis with left lateral fusion of the T10-T12 levels and a age-indeterminate compression deformity at L1 with up to 50% height loss anteriorly. 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 Straightening of the normal thoracic kyphosis elsewhere. Additional degenerative changes in the shoulders with surgical anchors along the inferior left glenoid. Review of the MIP images confirms the above findings. IMPRESSION: 1. Borderline opacification of the pulmonary arteries. No convincing pulmonary arterial filling defects are identified. 2. Widespread multifocal geographic regions of mixed consolidative and ground-glass opacity throughout both lungs concerning for a multifocal infectious process with a differential including atypical viral etiologies such as COVID-19. 3. Coronary artery atherosclerosis. Aortic Atherosclerosis (ICD10-I70.0). 4. Centrilobular and paraseptal emphysema ( Emphysema (ICD10-J43.9). 5. Age-indeterminate compression deformity at L1 with 50% height loss anteriorly. Correlate for focal tenderness to assess for acuity. 6. Focal kyphosis across the laterally fused T10-T12 levels. Truncated appearance of the left ninth rib, possibly postsurgical change. Electronically Signed   By: Kreg Shropshire M.D.   On: 04/07/2021 02:33   DG Chest  Portable 1 View  Result Date: 04/07/2021 CLINICAL DATA:  Shortness of breath, headache for 4 days, recently seen for the same symptoms and left AMA EXAM: PORTABLE CHEST 1 VIEW COMPARISON:  Radiograph 04/05/2021, CT 03/19/2019 FINDINGS: Multifocal consolidation and hazy interstitial opacities throughout the lungs. No pneumothorax or visible effusion. Slight prominence of the cardiomediastinal silhouette though may be accentuated by low volumes and portable technique. Remaining cardiomediastinal contours are unremarkable. Telemetry leads overlie the chest. Prior surgical repair of the left shoulder and thoracolumbar fusion are incompletely assessed on this exam. IMPRESSION: Appearance consistent with a multifocal pneumonia in the appropriate clinical setting, asymmetric edema less favored though may present similarly. Electronically Signed   By: Kreg Shropshire M.D.   On: 04/07/2021 01:03   ASSESSMENT AND PLAN:   40 year old male with history of DM, asthma, class III obesity, on methadone maintenance therapy, presenting for the second time in 2 days with worsening shortness of breath, cough and myalgias.      Multifocal pneumonia   Acute respiratory failure with hypoxia (HCC)   Asthma with severe exacerbation versus bronchitis - Patient with increased work of breathing, speaking in short sentences, tachypneic, O2 sat 87% on room air requiring 3 L to maintain sats in the mid 90s - COVID PCR negative x2 on 5/1 and again on 5/2 - CTA chest negative for PE and showing multifocal pneumonia as would be seen in atypical viral pneumonia - Procalcitonin less than 0.1  -given severity of pneumonia will continue Rocephin and azithromycin. Pulmonary consultation placed for Dr Karna Christmas - Supplemental oxygen, antitussives - Scheduled and as needed nebulized bronchodilators - IV Solu-Medrol -- Reps viral panel pending    Methadone maintenance therapy patient 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 He used to be on Victoza, metformin, insulin -- will start on insulin 7030 20 units BID  Polysubstance drug abuse history of hepatitis C chronic -- tobacco and smokes marijuana -- urine drug screen pending    DVT prophylaxis: Lovenox  Code Status: full code  Family Communication:   family at bedside Disposition Plan: Back to previous home environment Consults called: none  Status:At the time of admission, it appears that the appropriate admission status for this patient is INPATIENT  Level of care: Med-Surg Status is: Inpatient  Remains inpatient appropriate because:Inpatient level of care appropriate due to severity of illness   Dispo: The patient is from: Home              Anticipated d/c is to: Home              Patient currently is not medically stable to d/c.   Difficult to place patient No        TOTAL TIME TAKING CARE OF THIS PATIENT: 25 minutes.  >50% time spent on counselling and coordination of care  Note: This dictation was prepared with Dragon dictation along with smaller phrase technology. Any transcriptional errors that result from this process are unintentional.  Enedina Finner M.D    [facility]   CC: Primary care physician; Filbert Berthold, MDPatient ID: Lauralyn Primes, male   DOB: January 11, 1981, 40 y.o.   MRN: [id number]

## 2021-04-07 NOTE — ED Notes (Signed)
Patient transported to CT 

## 2021-04-07 NOTE — Evaluation (Signed)
Occupational Therapy Evaluation Patient Details Name: Stephen Rangel MRN: [id number] DOB: Mar 14, 1981 Today's Date: 04/07/2021    History of Present Illness 40 y.o. male with medical history significant for DM, asthma, class III obesity, on methadone maintenance therapy, seen in the ER a day prior with recommendation for admission for bronchitis with hypoxia, with patient signing out AMA, who returns to the emergency room by EMS with continued and progressively worsening wheezing associated with cough productive of clear phlegm, myalgias and headache. COVID negative   Clinical Impression   Pt seen for OT evaluation this date. Prior to hospital admission, pt was independent in all aspects of ADL/IADL and functional mobility. Pt lives with his spouse in a one-level home with 3 steps to enter. Upon arrival to room, pt on 4.5L of O2 via [place], with SpO2 >94% while in bed and conversing with this author. Pt able to perform bed mobility and sit<>stand transfers independently and with ease. Prior to OOB mobility, pt placed on 6L of portable O2 d/t portable O2 tank unable to provide O2 between 4L and 6L. During standing UB dressing, O2 desat to 86%, however able to recover to >92% within 30sec of activity cessation. Pt able to march in place and ambulate around room (~30 ft total) without AD, however O2 desat to 86% (able to recover within 30 sec to >92% following cessation of activity and pt initiating PLB while standing). RN informed of SpO2 during session. Pt educated in energy conservation strategies including pursed lip breathing, activity pacing, home/routines modifications, work simplification, AE/DME, prioritizing of meaningful occupations, and falls prevention; handout provided and pt verbalized understanding. Pt currently demonstrates baseline independence to perform ADLs and mobility tasks, requiring SUPERVISION only to monitor O2 needs. Recommend that care team continue to supervise O2 sats. No additional  skilled OT needs identified at this time. OT will sign off. Please re-consult if additional OT needs arise.    Follow Up Recommendations  No OT follow up;Supervision - Intermittent    Equipment Recommendations  None recommended by OT       Precautions / Restrictions Precautions Precautions: None Restrictions Weight Bearing Restrictions: No      Mobility Bed Mobility Overal bed mobility: Independent             General bed mobility comments: Able to perform with ease    Transfers Overall transfer level: Independent Equipment used: None             General transfer comment: Able to perform with ease    Balance Overall balance assessment: Independent                                         ADL either performed or assessed with clinical judgement   ADL Overall ADL's : Needs assistance/impaired                 Upper Body Dressing : Supervision/safety;Standing Upper Body Dressing Details (indicate cue type and reason): supervision for monitoring SpO2 and O2 needs only                 Functional mobility during ADLs: Supervision/safety (supervision for monitoring SpO2 and O2 needs. verbal cues for initiating energy conservation strategies)              Extremity/Trunk Assessment Upper Extremity Assessment Upper Extremity Assessment: Overall WFL for tasks assessed   Lower Extremity Assessment Lower  Extremity Assessment: Overall WFL for tasks assessed   Cervical / Trunk Assessment Cervical / Trunk Assessment: Normal      Cognition Arousal/Alertness: Awake/alert Behavior During Therapy: WFL for tasks assessed/performed Overall Cognitive Status: Within Functional Limits for tasks assessed                                     General Comments  Upon arrival to room, pt on 4.5L of O2 via [place], with SpO2 >94% while in bed and conversing with this author. Following bed mobility, pt placed on 6L of portable O2  (portable O2 tank unable to provide O2 between 4L and 6L). During standing UB dressing, O2 desat to 86%, however able to recover to >92% within 30sec of activity cessation. Pt able to march in place and ambulate around room (~30 ft total) without AD, however O2 desat to 86% (able to recover within 30 sec to >92% following cessation of activity and initiation of PLB while standing).    Exercises Other Exercises Other Exercises: Pt educated in energy conservation strategies including pursed lip breathing, activity pacing, home/routines modifications, work simplification, AE/DME, prioritizing of meaningful occupations, and falls prevention; handout provided and pt verbalized understanding             OT Problem List: Decreased activity tolerance;Cardiopulmonary status limiting activity;Decreased knowledge of precautions         OT Goals(Current goals can be found in the care plan section) Acute Rehab OT Goals Patient Stated Goal: to improve breathing OT Goal Formulation: With patient Time For Goal Achievement: 04/21/21 Potential to Achieve Goals: Good  OT Frequency:      AM-PAC OT "6 Clicks" Daily Activity     Outcome Measure Help from another person eating meals?: None Help from another person taking care of personal grooming?: None Help from another person toileting, which includes using toliet, bedpan, or urinal?: A Little Help from another person bathing (including washing, rinsing, drying)?: A Little Help from another person to put on and taking off regular upper body clothing?: A Little Help from another person to put on and taking off regular lower body clothing?: A Little 6 Click Score: 20   End of Session Equipment Utilized During Treatment: Oxygen Nurse Communication: Mobility status;Other (comment) (SpO2)  Activity Tolerance: Patient tolerated treatment well Patient left: in bed;with call bell/phone within reach  OT Visit Diagnosis: Muscle weakness (generalized)  (M62.81)                Time: 9323-5573 OT Time Calculation (min): 27 min Charges:  OT General Charges $OT Visit: 1 Visit OT Evaluation $OT Eval Moderate Complexity: 1 Mod OT Treatments $Self Care/Home Management : 8-22 mins  Matthew Folks, OTR/L [facility] [phone]

## 2021-04-07 NOTE — ED Provider Notes (Signed)
[facility] Emergency Department Provider Note  ____________________________________________  Time seen: Approximately 2:04 AM  I have reviewed the triage vital signs and the nursing notes.   HISTORY  Chief Complaint Shortness of Breath   HPI Stephen Rangel is a 40 y.o. male with history of diabetes and asthma who presents for evaluation of shortness of breath.  Patient reports feeling unwell for the last 5 days.  Has had cough that is productive of clear phlegm, myalgias, wheezing, now headache, and progressively worsening shortness of breath.  Was seen here yesterday for the same in the setting of not having his albuterol inhaler.  He was recommended to be admitted for hypoxia but decided to leave AMA.  He reports that this evening he could not catch his breath and he became very agitated which made him call 911.  He denies chest pain, personal or family history.  DVT, recent travel immobilization, leg pain or swelling, hemoptysis, or exogenous hormones.   Past Medical History:  Diagnosis Date  . Diabetes mellitus without complication (HCC)   . Liver disease     There are no problems to display for this patient.   No past surgical history on file.  Prior to Admission medications   Medication Sig Start Date End Date Taking? 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   Yes [provider]  albuterol (VENTOLIN HFA) 108 (90 Base) MCG/ACT inhaler Inhale 2 puffs into the lungs every 6 (six) hours as needed for up to 1 dose for wheezing or shortness of breath. 04/05/21   Gilles Chiquito, MD  doxycycline (VIBRAMYCIN) 100 MG capsule Take 1 capsule (100 mg total) by mouth 2 (two) times daily for 10 days. 04/05/21 04/15/21  Gilles Chiquito, MD  predniSONE (DELTASONE) 10 MG tablet Take 4 tablets (40 mg total) by mouth daily for 4 days. 04/05/21 04/09/21  Gilles Chiquito, MD    Allergies Patient has no  known allergies.  No family history on file.  Social History Social History   Tobacco Use  . Smoking status: Current Every Day Smoker  . Smokeless tobacco: Never Used  Vaping Use  . Vaping Use: Never used    Review of Systems  Constitutional: Negative for fever. + myalgias Eyes: Negative for visual changes. ENT: Negative for sore throat. Neck: No neck pain  Cardiovascular: Negative for chest pain. Respiratory: + shortness of breath, cough, wheezing Gastrointestinal: Negative for abdominal pain, vomiting or diarrhea. Genitourinary: Negative for dysuria. Musculoskeletal: Negative for back pain. Skin: Negative for rash. Neurological: Negative for weakness or numbness. + HA Psych: No SI or HI  ____________________________________________   PHYSICAL EXAM:  VITAL SIGNS: ED Triage Vitals  Enc Vitals Group     BP 04/06/21 2353 (!) 145/77     Pulse Rate 04/06/21 2353 93     Resp 04/06/21 2353 (!) 22     Temp 04/06/21 2353 98.7 F (37.1 C)     Temp Source 04/06/21 2353 Oral     SpO2 04/06/21 2353 (!) 87 %     Weight 04/06/21 2352 300 lb (136.1 kg)     Height 04/06/21 2352 5\' 11"  (1.803 m)     Head Circumference --      Peak Flow --      Pain Score 04/07/21 0008 6     Pain Loc --      Pain Edu? --      Excl. in  GC? --     Constitutional: Alert and oriented. Well appearing and in no apparent distress. HEENT:      Head: Normocephalic and atraumatic.         Eyes: Conjunctivae are normal. Sclera is non-icteric.       Mouth/Throat: Mucous membranes are moist.       Neck: Supple with no signs of meningismus. Cardiovascular: Regular rate and rhythm. No murmurs, gallops, or rubs. 2+ symmetrical distal pulses are present in all extremities. No JVD. Respiratory: Increased work of breathing, hypoxic to the low 90s on room air with diffuse wheezing bilaterally Gastrointestinal: Soft, non tender. Musculoskeletal:  No edema, cyanosis, or erythema of extremities. Neurologic:  Normal speech and language. Face is symmetric. Moving all extremities. No gross focal neurologic deficits are appreciated. Skin: Skin is warm, dry and intact. No rash noted. Psychiatric: Mood and affect are normal. Speech and behavior are normal.  ____________________________________________   LABS (all labs ordered are listed, but only abnormal results are displayed)  Labs Reviewed  CBC WITH DIFFERENTIAL/PLATELET - Abnormal; Notable for the following components:      Result Value   WBC 16.8 (*)    Neutro Abs 13.9 (*)    All other components within normal limits  COMPREHENSIVE METABOLIC PANEL - Abnormal; Notable for the following components:   Glucose, Bld 223 (*)    BUN 25 (*)    Creatinine, Ser 0.52 (*)    Calcium 8.4 (*)    Albumin 3.3 (*)    All other components within normal limits  D-DIMER, QUANTITATIVE - Abnormal; Notable for the following components:   D-Dimer, Quant 0.83 (*)    All other components within normal limits  RESP PANEL BY RT-PCR (FLU A&B, COVID) [id number]  PROCALCITONIN  BRAIN NATRIURETIC PEPTIDE  TROPONIN I (HIGH SENSITIVITY)   ____________________________________________  EKG  ED ECG REPORT I, Nita Sicklearolina Sandip Power, the attending physician, personally viewed and interpreted this ECG.  Sinus rhythm, rate of 92, normal intervals, normal axis, no ST elevations or depressions ____________________________________________  RADIOLOGY  I have personally reviewed the images performed during this visit and I agree with the Radiologist's read.   Interpretation by Radiologist:  CT Angio Chest PE W and/or Wo Contrast  Result Date: 04/07/2021 CLINICAL DATA:  PE suspected, low to intermediate probability, positive D-dimer EXAM: CT ANGIOGRAPHY CHEST WITH CONTRAST TECHNIQUE: Multidetector CT imaging of the chest was performed using the standard protocol during bolus administration of intravenous contrast. Multiplanar CT image reconstructions and MIPs were obtained to  evaluate the vascular anatomy. CONTRAST:  100mL OMNIPAQUE IOHEXOL 350 MG/ML SOLN COMPARISON:  Radiographs 04/07/2021, 04/05/2021 FINDINGS: Cardiovascular: Borderline opacification of the pulmonary arteries. No central, lobar or proximal segmental filling defects are convincingly demonstrated. Top-normal heart size. No pericardial effusion. Coronary artery calcifications are present. Atherosclerotic plaque within the normal caliber aorta. No acute luminal abnormality of the imaged aorta. No periaortic stranding or hemorrhage. Normal 3 vessel branching of the aortic arch. Proximal great vessels are unremarkable. No major venous abnormalities or significant venous reflux. Mediastinum/Nodes: Scattered borderline enlarged mediastinal and hilar nodes are present for instance a 13 mm right paratracheal node (5/96), a 14 mm subcarinal node (5/123), and a 14 mm right hilar node (5/119). No worrisome axillary adenopathy. Normal thyroid and thoracic inlet. No acute abnormality of the trachea or esophagus. Lungs/Pleura: Geographic regions of multifocal mixed ground-glass and consolidative opacity throughout both lungs. Findings on a background of mild paraseptal and centrilobular emphysematous change. Diffuse airways thickening and scattered secretions  could reflect chronic bronchitic change or more acute airways inflammation. No pneumothorax or visible pleural effusion. No concerning pulmonary nodules or masses are seen within the limitations of underlying parenchymal disease Upper Abdomen: No acute abnormalities present in the visualized portions of the upper abdomen. Musculoskeletal: Multilevel degenerative changes are present in the imaged portions of the spine. Exaggerated thoracic kyphosis with left lateral fusion of the T10-T12 levels and a age-indeterminate compression deformity at L1 with up to 50% height loss anteriorly. 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 Straightening of the normal thoracic  kyphosis elsewhere. Additional degenerative changes in the shoulders with surgical anchors along the inferior left glenoid. Review of the MIP images confirms the above findings. IMPRESSION: 1. Borderline opacification of the pulmonary arteries. No convincing pulmonary arterial filling defects are identified. 2. Widespread multifocal geographic regions of mixed consolidative and ground-glass opacity throughout both lungs concerning for a multifocal infectious process with a differential including atypical viral etiologies such as COVID-19. 3. Coronary artery atherosclerosis. Aortic Atherosclerosis (ICD10-I70.0). 4. Centrilobular and paraseptal emphysema ( Emphysema (ICD10-J43.9). 5. Age-indeterminate compression deformity at L1 with 50% height loss anteriorly. Correlate for focal tenderness to assess for acuity. 6. Focal kyphosis across the laterally fused T10-T12 levels. Truncated appearance of the left ninth rib, possibly postsurgical change. Electronically Signed   By: Kreg Shropshire M.D.   On: 04/07/2021 02:33   DG Chest Portable 1 View  Result Date: 04/07/2021 CLINICAL DATA:  Shortness of breath, headache for 4 days, recently seen for the same symptoms and left AMA EXAM: PORTABLE CHEST 1 VIEW COMPARISON:  Radiograph 04/05/2021, CT 03/19/2019 FINDINGS: Multifocal consolidation and hazy interstitial opacities throughout the lungs. No pneumothorax or visible effusion. Slight prominence of the cardiomediastinal silhouette though may be accentuated by low volumes and portable technique. Remaining cardiomediastinal contours are unremarkable. Telemetry leads overlie the chest. Prior surgical repair of the left shoulder and thoracolumbar fusion are incompletely assessed on this exam. IMPRESSION: Appearance consistent with a multifocal pneumonia in the appropriate clinical setting, asymmetric edema less favored though may present similarly. Electronically Signed   By: Kreg Shropshire M.D.   On: 04/07/2021 01:03      ____________________________________________   PROCEDURES  Procedure(s) performed:yes .1-3 Lead EKG Interpretation Performed by: Nita Sickle, MD Authorized by: Nita Sickle, MD     Interpretation: non-specific     ECG rate assessment: normal     Rhythm: sinus rhythm     Ectopy: none     Conduction: normal     Critical Care performed: yes  CRITICAL CARE Performed by: Nita Sickle  ?  Total critical care time: 30 min  Critical care time was exclusive of separately billable procedures and treating other patients.  Critical care was necessary to treat or prevent imminent or life-threatening deterioration.  Critical care was time spent personally by me on the following activities: development of treatment plan with patient and/or surrogate as well as nursing, discussions with consultants, evaluation of patient's response to treatment, examination of patient, obtaining history from patient or surrogate, ordering and performing treatments and interventions, ordering and review of laboratory studies, ordering and review of radiographic studies, pulse oximetry and re-evaluation of patient's condition.  ____________________________________________   INITIAL IMPRESSION / ASSESSMENT AND PLAN / ED COURSE   40 y.o. male with history of diabetes and asthma who presents for evaluation of shortness of breath, wheezing, productive cough, headache, myalgias.  Patient mild respiratory distress, tachypneic and satting in the low 90s with diffuse wheezing bilaterally.  Seen here yesterday for the same complaints with a negative COVID and flu test.  Did have chest x-ray consistent with multifocal pneumonia.  Patient reports taking antibiotics at home and not feeling improved.  Ddx pneumonia versus viral syndrome versus PE versus bronchitis versus asthma exacerbation versus pericarditis.  EKG with no signs of ischemia or ST elevation.  Labs showing elevated white count of  16.8 with a left shift in the setting of receiving Solu-Medrol yesterday.  Sugars also slightly elevated most likely from steroids with no signs of DKA.  Troponin is negative.  BNP is negative.  Chest x-ray visualized by me showing worsening multifocal pneumonia.  Procalcitonin is pending.  D-dimer is elevated therefore CT angio was done showing no signs of PE but diffuse multifocal pneumonia. Repeat covid pending since procalcitonin is negative. Will start rocephin and azithromcin. Patient remains on 2 L of oxygen for hypoxia.  Will admit to the hospitalist service.  Medical records reviewed from patient's visit yesterday.  Patient placed on telemetry for close monitoring of cardiorespiratory status.        _____________________________________________ Please note:  Patient was evaluated in Emergency Department today for the symptoms described in the history of present illness. Patient was evaluated in the context of the global COVID-19 pandemic, which necessitated consideration that the patient might be at risk for infection with the SARS-CoV-2 virus that causes COVID-19. Institutional protocols and algorithms that pertain to the evaluation of patients at risk for COVID-19 are in a state of rapid change based on information released by regulatory bodies including the CDC and federal and state organizations. These policies and algorithms were followed during the patient's care in the ED.  Some ED evaluations and interventions may be delayed as a result of limited staffing during the pandemic.   [place] Controlled Substance Database was reviewed by me. ____________________________________________   FINAL CLINICAL IMPRESSION(S) / ED DIAGNOSES   Final diagnoses:  Acute respiratory failure with hypoxia (HCC)  Community acquired pneumonia, unspecified laterality  Exacerbation of asthma, unspecified asthma severity, unspecified whether persistent      NEW MEDICATIONS STARTED DURING THIS  VISIT:  ED Discharge Orders    None       Note:  This document was prepared using Dragon voice recognition software and may include unintentional dictation errors.    Don Perking, [place], MD 04/07/21 [phone]

## 2021-04-07 NOTE — ED Notes (Signed)
RN aware of bed assignment 

## 2021-04-07 NOTE — ED Provider Notes (Incomplete)
[facility] Emergency Department Provider Note  ____________________________________________  Time seen: Approximately 2:04 AM  I have reviewed the triage vital signs and the nursing notes.   HISTORY  Chief Complaint Shortness of Breath   HPI Stephen Rangel is a 40 y.o. male with history of diabetes and asthma who presents for evaluation of shortness of breath.  Patient reports feeling unwell for the last 5 days.  Has had cough that is productive of clear phlegm, myalgias, wheezing, now headache, and progressively worsening shortness of breath.  Was seen here yesterday for the same in the setting of not having his albuterol inhaler.  He was recommended to be admitted for hypoxia but decided to leave AMA.  He reports that this evening he could not catch his breath and he became very agitated which made him call 911.  He denies chest pain, personal or family history.  DVT, recent travel immobilization, leg pain or swelling, hemoptysis, or exogenous hormones.   Past Medical History:  Diagnosis Date  . Diabetes mellitus without complication (HCC)   . Liver disease     There are no problems to display for this patient.   No past surgical history on file.  Prior to Admission medications   Medication Sig Start Date End Date Taking? Authorizing Provider  albuterol (VENTOLIN HFA) 108 (90 Base) MCG/ACT inhaler Inhale 2 puffs into the lungs every 6 (six) hours as needed for up to 1 dose for wheezing or shortness of breath. 04/05/21   Gilles Chiquito, MD  doxycycline (VIBRAMYCIN) 100 MG capsule Take 1 capsule (100 mg total) by mouth 2 (two) times daily for 10 days. 04/05/21 04/15/21  Gilles Chiquito, MD  predniSONE (DELTASONE) 10 MG tablet Take 4 tablets (40 mg total) by mouth daily for 4 days. 04/05/21 04/09/21  Gilles Chiquito, MD    Allergies Patient has no known allergies.  No family history on file.  Social History Social History   Tobacco Use  . Smoking  status: Current Every Day Smoker  . Smokeless tobacco: Never Used  Vaping Use  . Vaping Use: Never used    Review of Systems  Constitutional: Negative for fever. + myalgias Eyes: Negative for visual changes. ENT: Negative for sore throat. Neck: No neck pain  Cardiovascular: Negative for chest pain. Respiratory: + shortness of breath, cough, wheezing Gastrointestinal: Negative for abdominal pain, vomiting or diarrhea. Genitourinary: Negative for dysuria. Musculoskeletal: Negative for back pain. Skin: Negative for rash. Neurological: Negative for weakness or numbness. + HA Psych: No SI or HI  ____________________________________________   PHYSICAL EXAM:  VITAL SIGNS: ED Triage Vitals  Enc Vitals Group     BP 04/06/21 2353 (!) 145/77     Pulse Rate 04/06/21 2353 93     Resp 04/06/21 2353 (!) 22     Temp 04/06/21 2353 98.7 F (37.1 C)     Temp Source 04/06/21 2353 Oral     SpO2 04/06/21 2353 (!) 87 %     Weight 04/06/21 2352 300 lb (136.1 kg)     Height 04/06/21 2352 5\' 11"  (1.803 m)     Head Circumference --      Peak Flow --      Pain Score 04/07/21 0008 6     Pain Loc --      Pain Edu? --      Excl. in GC? --     Constitutional: Alert and oriented. Well appearing and in no apparent distress. HEENT:  Head: Normocephalic and atraumatic.         Eyes: Conjunctivae are normal. Sclera is non-icteric.       Mouth/Throat: Mucous membranes are moist.       Neck: Supple with no signs of meningismus. Cardiovascular: Regular rate and rhythm. No murmurs, gallops, or rubs. 2+ symmetrical distal pulses are present in all extremities. No JVD. Respiratory: Increased work of breathing, hypoxic to the low 90s on room air with diffuse wheezing bilaterally Gastrointestinal: Soft, non tender. Musculoskeletal:  No edema, cyanosis, or erythema of extremities. Neurologic: Normal speech and language. Face is symmetric. Moving all extremities. No gross focal neurologic deficits are  appreciated. Skin: Skin is warm, dry and intact. No rash noted. Psychiatric: Mood and affect are normal. Speech and behavior are normal.  ____________________________________________   LABS (all labs ordered are listed, but only abnormal results are displayed)  Labs Reviewed  CBC WITH DIFFERENTIAL/PLATELET - Abnormal; Notable for the following components:      Result Value   WBC 16.8 (*)    Neutro Abs 13.9 (*)    All other components within normal limits  COMPREHENSIVE METABOLIC PANEL - Abnormal; Notable for the following components:   Glucose, Bld 223 (*)    BUN 25 (*)    Creatinine, Ser 0.52 (*)    Calcium 8.4 (*)    Albumin 3.3 (*)    All other components within normal limits  D-DIMER, QUANTITATIVE - Abnormal; Notable for the following components:   D-Dimer, Quant 0.83 (*)    All other components within normal limits  BRAIN NATRIURETIC PEPTIDE  PROCALCITONIN  TROPONIN I (HIGH SENSITIVITY)   ____________________________________________  EKG  ED ECG REPORT I, Nita Sickle, the attending physician, personally viewed and interpreted this ECG.  Sinus rhythm, rate of 92, normal intervals, normal axis, no ST elevations or depressions ____________________________________________  RADIOLOGY  I have personally reviewed the images performed during this visit and I agree with the Radiologist's read.   Interpretation by Radiologist:  DG Chest Portable 1 View  Result Date: 04/07/2021 CLINICAL DATA:  Shortness of breath, headache for 4 days, recently seen for the same symptoms and left AMA EXAM: PORTABLE CHEST 1 VIEW COMPARISON:  Radiograph 04/05/2021, CT 03/19/2019 FINDINGS: Multifocal consolidation and hazy interstitial opacities throughout the lungs. No pneumothorax or visible effusion. Slight prominence of the cardiomediastinal silhouette though may be accentuated by low volumes and portable technique. Remaining cardiomediastinal contours are unremarkable. Telemetry leads  overlie the chest. Prior surgical repair of the left shoulder and thoracolumbar fusion are incompletely assessed on this exam. IMPRESSION: Appearance consistent with a multifocal pneumonia in the appropriate clinical setting, asymmetric edema less favored though may present similarly. Electronically Signed   By: Kreg Shropshire M.D.   On: 04/07/2021 01:03     ____________________________________________   PROCEDURES  Procedure(s) performed:yes .1-3 Lead EKG Interpretation Performed by: Nita Sickle, MD Authorized by: Nita Sickle, MD     Interpretation: non-specific     ECG rate assessment: normal     Rhythm: sinus rhythm     Ectopy: none     Conduction: normal     Critical Care performed: *** None ____________________________________________   INITIAL IMPRESSION / ASSESSMENT AND PLAN / ED COURSE   40 y.o. male with history of diabetes and asthma who presents for evaluation of shortness of breath, wheezing, productive cough, headache, myalgias.  Patient mild respiratory distress, tachypneic and satting in the low 90s with diffuse wheezing bilaterally.    Seen here yesterday for the  same complaints with a negative COVID and flu test.  Did have chest x-ray consistent with multifocal pneumonia.  Patient reports taking antibiotics at home and not feeling improved.  Ddx pneumonia versus viral syndrome versus PE versus bronchitis versus asthma exacerbation versus pericarditis.  EKG with no signs of ischemia or ST elevation.  Labs showing elevated white count of 16.8 with a left shift in the setting of receiving Solu-Medrol yesterday.  Sugars also slightly elevated most likely from steroids with no signs of DKA.  Troponin is negative.  BNP is negative.  Chest x-ray visualized by me showing worsening multifocal pneumonia.  Procalcitonin is pending.  D-dimer is elevated therefore CT angio was done showing no signs of PE but diffuse pneumonia  ***Review  labs Independently  visualization and interpretation of Radiology Review old medical records Discuss care with family or another doc Get history from family Independent visualization of rhythm strip      _____________________________________________ Please note:  Patient was evaluated in Emergency Department today for the symptoms described in the history of present illness. Patient was evaluated in the context of the global COVID-19 pandemic, which necessitated consideration that the patient might be at risk for infection with the SARS-CoV-2 virus that causes COVID-19. Institutional protocols and algorithms that pertain to the evaluation of patients at risk for COVID-19 are in a state of rapid change based on information released by regulatory bodies including the CDC and federal and state organizations. These policies and algorithms were followed during the patient's care in the ED.  Some ED evaluations and interventions may be delayed as a result of limited staffing during the pandemic.   [place] Controlled Substance Database was reviewed by me. ____________________________________________   FINAL CLINICAL IMPRESSION(S) / ED DIAGNOSES   Final diagnoses:  None      NEW MEDICATIONS STARTED DURING THIS VISIT:  ED Discharge Orders    None       Note:  This document was prepared using Dragon voice recognition software and may include unintentional dictation errors.

## 2021-04-07 NOTE — Progress Notes (Signed)
PT Cancellation Note  Patient Details Name: Stephen Rangel MRN: [id number] DOB: Feb 07, 1981   Cancelled Treatment:    Reason Eval/Treat Not Completed: Other (comment). Per chart review, pt indep at baseline. Per conversation with OT, pt able to ambulate in room independently with use of O2. Recommend continue medical supervision of O2 sats with care team, however no formalized need for acute PT at this time. Will plan to cancel current order. Please re-order if needs change.   Alida Greiner 04/07/2021, 10:11 AM Elizabeth Palau, PT, DPT [id number]

## 2021-04-07 NOTE — Progress Notes (Signed)
Nutrition Brief Note  RD received consult for nutritional assessment   40 year old male with history ofDM, asthma, class III obesity, on methadone maintenance therapy whopresents for the second time in 2 days with worsening shortness of breath, cough and myalgias.  Met with pt in room today. Pt reports good appetite and oral intake pta and in hospital; pt eating 100% of meals. Pt reports that he is weight stable at baseline. Pt with concerns about low testosterone; reports that he is active and has been trying to loose weight for a long time. Pt had an appointment today with his PCP to have his testosterone levels checked but reports that he plans to reschedule after discharge.   Wt Readings from Last 15 Encounters:  04/06/21 136.1 kg  04/05/21 136.1 kg  03/04/21 131.5 kg  03/02/21 95.7 kg    Body mass index is 41.84 kg/m. Patient meets criteria for morbid obesity based on current BMI.   Current diet order is CHO modified, patient is consuming approximately 100% of meals at this time. Labs and medications reviewed.   RD will add Ensure Max protein supplement BID to help pt meet his protein needs, each supplement provides 150kcal and 30g of protein.  No further nutrition interventions warranted at this time. If nutrition issues arise, please consult RD.   Koleen Distance MS, RD, LDN Please refer to [facility] for RD and/or RD on-call/weekend/after hours pager

## 2021-04-07 NOTE — H&P (Signed)
History and Physical    Stephen Rangel KZL:[id number] DOB: 28-Jul-1981 DOA: 04/06/2021  PCP: Filbert Berthold, MD   Patient coming from: Home  I have personally briefly reviewed patient's old medical records in [facility]  Chief Complaint: Shortness of breath  HPI: Stephen Rangel is a 40 y.o. male with medical history significant for DM, asthma, class III obesity, on methadone maintenance therapy, seen in the ER a day prior with recommendation for admission for bronchitis with hypoxia, with patient signing out AMA, who returns to the emergency room by EMS with continued and progressively worsening wheezing associated with cough productive of clear phlegm, myalgias and headache..  He denied chest pain, fever or chills.  Denies leg pain or swelling. ED course: On arrival afebrile, BP 145/77, pulse 93, respiratory rate 22 with O2 sat 87% on room air.  Blood work significant for WBC 16,000, procalcitonin less than 0.1, BNP 61.  Troponin 5.  D-dimer 0.83.  COVID PCR and flu again negative EKG personally viewed and interpreted: NSR at 92 with no acute ST-T wave changes Imaging: CTA chest: Negative for PE but with widespread multifocal regions of mixed consolidative and groundglass opacity concerning for multifocal infectious process with differential including atypical viral etiologies such as COVID.  Also showed centrilobular emphysema and coronary artery atherosclerosis  Patient was empirically treated with Rocephin and azithromycin and received several rounds of DuoNeb as well as IV Solu-Medrol and magnesium.  Hospitalist consulted for admission.  Review of Systems: As per HPI otherwise all other systems on review of systems negative.    Past Medical History:  Diagnosis Date  . Diabetes mellitus without complication (HCC)   . Liver disease     History reviewed. No pertinent surgical history.   reports that he has been smoking. He has never used smokeless tobacco. No history on file for alcohol  use and drug use.  No Known Allergies  History reviewed. No pertinent family history.    Prior to Admission medications   Medication Sig Start Date End Date Taking? 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   Yes [provider]  albuterol (VENTOLIN HFA) 108 (90 Base) MCG/ACT inhaler Inhale 2 puffs into the lungs every 6 (six) hours as needed for up to 1 dose for wheezing or shortness of breath. 04/05/21   Gilles Chiquito, MD  doxycycline (VIBRAMYCIN) 100 MG capsule Take 1 capsule (100 mg total) by mouth 2 (two) times daily for 10 days. 04/05/21 04/15/21  Gilles Chiquito, MD  predniSONE (DELTASONE) 10 MG tablet Take 4 tablets (40 mg total) by mouth daily for 4 days. 04/05/21 04/09/21  Gilles Chiquito, MD    Physical Exam: Vitals:   04/06/21 2354 04/07/21 0006 04/07/21 0100 04/07/21 0240  BP:  (!) 127/96 135/71 135/71  Pulse:  89 96 96  Resp:   (!) 24 (!) 27  Temp:  98.1 F (36.7 C)    TempSrc:  Oral    SpO2: 95% 95% 98% 98%  Weight:      Height:         Vitals:   04/06/21 2354 04/07/21 0006 04/07/21 0100 04/07/21 0240  BP:  (!) 127/96 135/71 135/71  Pulse:  89 96 96  Resp:   (!) 24 (!) 27  Temp:  98.1 F (36.7 C)    TempSrc:  Oral    SpO2: 95% 95% 98% 98%  Weight:      Height:  Constitutional: Alert and oriented x 3 .  Conversational dyspnea HEENT:      Head: Normocephalic and atraumatic.         Eyes: PERLA, EOMI, Conjunctivae are normal. Sclera is non-icteric.       Mouth/Throat: Mucous membranes are moist.       Neck: Supple with no signs of meningismus. Cardiovascular: Regular rate and rhythm. No murmurs, gallops, or rubs. 2+ symmetrical distal pulses are present . No JVD. No LE edema Respiratory: Respiratory effort increased. tachypneic.  Speaking in one-2-word sentences coarse breath sounds and wheezing bilaterally .  Gastrointestinal: Soft, non tender, and non distended with positive bowel  sounds.  Genitourinary: No CVA tenderness. Musculoskeletal: Nontender with normal range of motion in all extremities. No cyanosis, or erythema of extremities. Neurologic:  Face is symmetric. Moving all extremities. No gross focal neurologic deficits . Skin: Skin is warm, dry.  No rash or ulcers Psychiatric: Mood and affect are normal    Labs on Admission: I have personally reviewed following labs and imaging studies  CBC: Recent Labs  Lab 04/05/21 1243 04/06/21 0005  WBC 9.9 16.8*  NEUTROABS 7.2 13.9*  HGB 13.0 13.1  HCT 40.2 39.2  MCV 86.3 85.2  PLT 156 176   Basic Metabolic Panel: Recent Labs  Lab 04/05/21 1243 04/06/21 0005  NA 135 136  K 4.4 4.0  CL 101 104  CO2 25 24  GLUCOSE 129* 223*  BUN 16 25*  CREATININE 0.71 0.52*  CALCIUM 8.4* 8.4*   GFR: Estimated Creatinine Clearance: 169.5 mL/min (A) (by C-G formula based on SCr of 0.52 mg/dL (L)). Liver Function Tests: Recent Labs  Lab 04/05/21 1243 04/06/21 0005  AST 37 38  ALT 41 39  ALKPHOS 83 84  BILITOT 0.6 0.5  PROT 7.5 7.2  ALBUMIN 3.6 3.3*   No results for input(s): LIPASE, AMYLASE in the last 168 hours. No results for input(s): AMMONIA in the last 168 hours. Coagulation Profile: No results for input(s): INR, PROTIME in the last 168 hours. Cardiac Enzymes: No results for input(s): CKTOTAL, CKMB, CKMBINDEX, TROPONINI in the last 168 hours. BNP (last 3 results) No results for input(s): PROBNP in the last 8760 hours. HbA1C: No results for input(s): HGBA1C in the last 72 hours. CBG: No results for input(s): GLUCAP in the last 168 hours. Lipid Profile: No results for input(s): CHOL, HDL, LDLCALC, TRIG, CHOLHDL, LDLDIRECT in the last 72 hours. Thyroid Function Tests: No results for input(s): TSH, T4TOTAL, FREET4, T3FREE, THYROIDAB in the last 72 hours. Anemia Panel: No results for input(s): VITAMINB12, FOLATE, FERRITIN, TIBC, IRON, RETICCTPCT in the last 72 hours. Urine analysis: No results found  for: COLORURINE, APPEARANCEUR, LABSPEC, PHURINE, GLUCOSEU, HGBUR, BILIRUBINUR, KETONESUR, PROTEINUR, UROBILINOGEN, NITRITE, LEUKOCYTESUR  Radiological Exams on Admission: DG Chest 2 View  Result Date: 04/05/2021 CLINICAL DATA:  Shortness of breath.  Productive cough. EXAM: CHEST - 2 VIEW COMPARISON:  March 19, 2019 FINDINGS: Patchy bilateral pulmonary infiltrates, particularly in the bases. The heart, hila, mediastinum, lungs, and pleura are otherwise unremarkable. Surgical hardware, pedicle rods and screws, are seen in the thoracolumbar spine. IMPRESSION: 1. Bi basilar patchy infiltrates most consistent with multifocal pneumonia. Recommend short-term follow-up imaging to ensure resolution. Electronically Signed   By: Gerome Sam III M.D   On: 04/05/2021 13:22   CT Angio Chest PE W and/or Wo Contrast  Result Date: 04/07/2021 CLINICAL DATA:  PE suspected, low to intermediate probability, positive D-dimer EXAM: CT ANGIOGRAPHY CHEST WITH CONTRAST TECHNIQUE: Multidetector CT imaging  of the chest was performed using the standard protocol during bolus administration of intravenous contrast. Multiplanar CT image reconstructions and MIPs were obtained to evaluate the vascular anatomy. CONTRAST:  100mL OMNIPAQUE IOHEXOL 350 MG/ML SOLN COMPARISON:  Radiographs 04/07/2021, 04/05/2021 FINDINGS: Cardiovascular: Borderline opacification of the pulmonary arteries. No central, lobar or proximal segmental filling defects are convincingly demonstrated. Top-normal heart size. No pericardial effusion. Coronary artery calcifications are present. Atherosclerotic plaque within the normal caliber aorta. No acute luminal abnormality of the imaged aorta. No periaortic stranding or hemorrhage. Normal 3 vessel branching of the aortic arch. Proximal great vessels are unremarkable. No major venous abnormalities or significant venous reflux. Mediastinum/Nodes: Scattered borderline enlarged mediastinal and hilar nodes are present for  instance a 13 mm right paratracheal node (5/96), a 14 mm subcarinal node (5/123), and a 14 mm right hilar node (5/119). No worrisome axillary adenopathy. Normal thyroid and thoracic inlet. No acute abnormality of the trachea or esophagus. Lungs/Pleura: Geographic regions of multifocal mixed ground-glass and consolidative opacity throughout both lungs. Findings on a background of mild paraseptal and centrilobular emphysematous change. Diffuse airways thickening and scattered secretions could reflect chronic bronchitic change or more acute airways inflammation. No pneumothorax or visible pleural effusion. No concerning pulmonary nodules or masses are seen within the limitations of underlying parenchymal disease Upper Abdomen: No acute abnormalities present in the visualized portions of the upper abdomen. Musculoskeletal: Multilevel degenerative changes are present in the imaged portions of the spine. Exaggerated thoracic kyphosis with left lateral fusion of the T10-T12 levels and a age-indeterminate compression deformity at L1 with up to 50% height loss anteriorly. 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 Straightening of the normal thoracic kyphosis elsewhere. Additional degenerative changes in the shoulders with surgical anchors along the inferior left glenoid. Review of the MIP images confirms the above findings. IMPRESSION: 1. Borderline opacification of the pulmonary arteries. No convincing pulmonary arterial filling defects are identified. 2. Widespread multifocal geographic regions of mixed consolidative and ground-glass opacity throughout both lungs concerning for a multifocal infectious process with a differential including atypical viral etiologies such as COVID-19. 3. Coronary artery atherosclerosis. Aortic Atherosclerosis (ICD10-I70.0). 4. Centrilobular and paraseptal emphysema ( Emphysema (ICD10-J43.9). 5. Age-indeterminate compression deformity at L1 with 50% height loss anteriorly.  Correlate for focal tenderness to assess for acuity. 6. Focal kyphosis across the laterally fused T10-T12 levels. Truncated appearance of the left ninth rib, possibly postsurgical change. Electronically Signed   By: Kreg ShropshirePrice  DeHay M.D.   On: 04/07/2021 02:33   DG Chest Portable 1 View  Result Date: 04/07/2021 CLINICAL DATA:  Shortness of breath, headache for 4 days, recently seen for the same symptoms and left AMA EXAM: PORTABLE CHEST 1 VIEW COMPARISON:  Radiograph 04/05/2021, CT 03/19/2019 FINDINGS: Multifocal consolidation and hazy interstitial opacities throughout the lungs. No pneumothorax or visible effusion. Slight prominence of the cardiomediastinal silhouette though may be accentuated by low volumes and portable technique. Remaining cardiomediastinal contours are unremarkable. Telemetry leads overlie the chest. Prior surgical repair of the left shoulder and thoracolumbar fusion are incompletely assessed on this exam. IMPRESSION: Appearance consistent with a multifocal pneumonia in the appropriate clinical setting, asymmetric edema less favored though may present similarly. Electronically Signed   By: Kreg ShropshirePrice  DeHay M.D.   On: 04/07/2021 01:03     Assessment/Plan 40 year old male with history of DM, asthma, class III obesity, on methadone maintenance therapy, presenting for the second time in 2 days with worsening shortness of breath, cough and myalgias.  Multifocal pneumonia   Acute respiratory failure with hypoxia (HCC)   Asthma with severe exacerbation versus bronchitis - Patient with increased work of breathing, speaking in short sentences, tachypneic, O2 sat 87% on room air requiring 3 L to maintain sats in the mid 90s - COVID PCR negative x2 on 5/1 and again on 5/2 - CTA chest negative for PE and showing multifocal pneumonia as would be seen in atypical viral pneumonia - Procalcitonin less than 0.1  -Received antibiotics in the ED.  Not continued on admission - Supplemental oxygen,  antitussives - Scheduled and as needed nebulized bronchodilators - IV Solu-Medrol - Consider ID consult    Methadone maintenance therapy patient (HCC) - Patient receives methadone daily, 118 mg a day - Continue methadone pending pharmacy verification    Obesity, Class III, BMI 40-49.9 (morbid obesity) (HCC) - Complicating factor to overall prognosis and care    DM (diabetes mellitus) (HCC) - Sliding scale insulin coverage    DVT prophylaxis: Lovenox  Code Status: full code  Family Communication:  none  Disposition Plan: Back to previous home environment Consults called: none  Status:At the time of admission, it appears that the appropriate admission status for this patient is INPATIENT. This is judged to be reasonable and necessary in order to provide the required intensity of service to ensure the patient's safety given the presenting symptoms, physical exam findings, and initial radiographic and laboratory data in the context of their  Comorbid conditions.   Patient requires inpatient status due to high intensity of service, high risk for further deterioration and high frequency of surveillance required.   I certify that at the point of admission it is my clinical judgment that the patient will require inpatient hospital care spanning beyond 2 midnights     Andris Baumann MD [facility]     04/07/2021, 3:35 AM

## 2021-04-07 NOTE — ED Triage Notes (Signed)
Pt c/o SHOB, headache for 4 days. Pt was recently seen here for same and left AMA.

## 2021-04-07 NOTE — Progress Notes (Signed)
Patient states having increased work of breathing and feels tight when breathes.  Neb albuterol given.

## 2021-04-07 NOTE — Progress Notes (Signed)
Met with patient and significant other in room; requesting that she stay overnight.  Informed both of visiting hours and visiting protocols (during Covid pandemic). Told them that she couldn't stay overnight, but could come back in the morning; Patient stated that he didn't feel safe without her with him..." I panic when I can't catch my breath and almost pass out... I can't reach my call bell... I need her here.Marland KitchenMarland KitchenI will find another hospital with better visiting hours/policy... no one has told me any of this..."  Told patient that we did not want him to leave and that tonight would be an exception.  She will be able to stay to help with his anxiety and that she will not be able to leave the building; if she leaves, she will not be able to come back in and that she also will not be able to stay tomorrow night and there after. Both voiced understanding and agreement. Barbaraann Faster, RN 10:06 PM 04/07/2021

## 2021-04-07 NOTE — Progress Notes (Signed)
Patient states thick secretions making it harder to breathe.  Mucinex given and respiratory alerted for evaluation.  Patient states inhaler helps him more than neb.  MD notified for inhaler per patient request.  Will conti9nue to montior.

## 2021-04-08 DIAGNOSIS — J189 Pneumonia, unspecified organism: Secondary | ICD-10-CM | POA: Diagnosis not present

## 2021-04-08 LAB — GLUCOSE, CAPILLARY
Glucose-Capillary: 102 mg/dL — ABNORMAL HIGH (ref 70–99)
Glucose-Capillary: 312 mg/dL — ABNORMAL HIGH (ref 70–99)
Glucose-Capillary: 231 mg/dL — ABNORMAL HIGH (ref 70–99)
Glucose-Capillary: 147 mg/dL — ABNORMAL HIGH (ref 70–99)

## 2021-04-08 LAB — RESPIRATORY PANEL BY PCR

## 2021-04-08 MED ORDER — TRAZODONE HCL 50 MG PO TABS
25.0000 mg | ORAL_TABLET | Freq: Every evening | ORAL | Status: DC | PRN
  Administered 2021-04-08: 25 mg via ORAL
  Filled 2021-04-08: qty 1

## 2021-04-08 MED ORDER — FUROSEMIDE 10 MG/ML IJ SOLN
40.0000 mg | Freq: Two times a day (BID) | INTRAMUSCULAR | Status: DC
  Administered 2021-04-08 – 2021-04-09 (×2): 40 mg via INTRAVENOUS
  Filled 2021-04-08 (×2): qty 4

## 2021-04-08 MED ORDER — CLONAZEPAM 0.25 MG PO TBDP
0.2500 mg | ORAL_TABLET | Freq: Two times a day (BID) | ORAL | Status: DC | PRN
  Administered 2021-04-08: 0.25 mg via ORAL
  Filled 2021-04-08: qty 1

## 2021-04-08 MED ORDER — AZITHROMYCIN 250 MG PO TABS
250.0000 mg | ORAL_TABLET | Freq: Every day | ORAL | Status: DC
  Administered 2021-04-09: 250 mg via ORAL
  Filled 2021-04-08: qty 1

## 2021-04-08 MED ORDER — ALUM & MAG HYDROXIDE-SIMETH 200-200-20 MG/5ML PO SUSP
30.0000 mL | ORAL | Status: DC | PRN
  Administered 2021-04-08 – 2021-04-09 (×2): 30 mL via ORAL
  Filled 2021-04-08 (×2): qty 30

## 2021-04-08 MED ORDER — ONDANSETRON HCL 4 MG/2ML IJ SOLN
4.0000 mg | INTRAMUSCULAR | Status: DC | PRN
  Administered 2021-04-08: 4 mg via INTRAVENOUS
  Filled 2021-04-08: qty 2

## 2021-04-08 MED ORDER — PREDNISONE 20 MG PO TABS
40.0000 mg | ORAL_TABLET | Freq: Every day | ORAL | Status: DC
  Administered 2021-04-08 – 2021-04-09 (×2): 40 mg via ORAL
  Filled 2021-04-08 (×2): qty 2

## 2021-04-08 MED ORDER — FUROSEMIDE 10 MG/ML IJ SOLN
40.0000 mg | Freq: Every day | INTRAMUSCULAR | Status: DC
  Administered 2021-04-08: 40 mg via INTRAVENOUS
  Filled 2021-04-08: qty 4

## 2021-04-08 NOTE — Clinical Social Work Note (Signed)
CSW acknowledges consult for home health/DME needs. Per MD, patient is independent and has no home health needs.  Charlynn Court, CSW [phone]

## 2021-04-08 NOTE — Plan of Care (Signed)
Patient education given regarding new need for oxygen.  Patient states oxygen increases his anxiety.  Increased length of oxygen tubing as well and reeducated patient on necessity of oxygen.  Patient verbalized understnaindg and states will continue oxygen therapy.

## 2021-04-08 NOTE — Plan of Care (Signed)

## 2021-04-08 NOTE — Progress Notes (Addendum)
Inpatient Diabetes Program Recommendations  AACE/ADA: New Consensus Statement on Inpatient Glycemic Control (2015)  Target Ranges:  Prepandial:   less than 140 mg/dL      Peak postprandial:   less than 180 mg/dL (1-2 hours)      Critically ill patients:  140 - 180 mg/dL   Lab Results  Component Value Date   GLUCAP 312 (H) 04/08/2021   HGBA1C 6.1 (H) 04/07/2021    Review of Glycemic Control Results for Stephen Rangel, Stephen Rangel (MRN [id number]) as of 04/08/2021 13:22  Ref. Range 04/07/2021 11:31 04/07/2021 18:03 04/07/2021 21:43 04/08/2021 07:38 04/08/2021 12:03  Glucose-Capillary Latest Ref Range: 70 - 99 mg/dL 681 (H) 157 (H) 262 (H) 102 (H) 312 (H)   Diabetes history: DM 2  Outpatient Diabetes medications: Has not taken insulin in several months Current orders for Inpatient glycemic control:  Novolog resistant tid with meals and HS Novolog mix 70/30 20 units bid Prednisone 40 mg daily  Inpatient Diabetes Program Recommendations:    Note A1C=6.1% without insulin however CBG's increased with steroids.  Note 70/30 restarted.  Explained to patient that he may need insulin with steroids at home.  He verbalized understanding.  States he has been doing better with his diet at home and that blood sugars have been better.  Will follow.   -Should not need insulin once steroids stopped.   Thanks  Beryl Meager, RN, BC-ADM Inpatient Diabetes Coordinator Pager [phone] (8a-5p)

## 2021-04-08 NOTE — TOC Initial Note (Signed)
Transition of Care [facility]) - Initial/Assessment Note    Patient Details  Name: Stephen Rangel MRN: [id number] Date of Birth: 06/01/1981  Transition of [facility] CM/SW Contact:    Candie Chroman, LCSW Phone Number: 04/08/2021, 12:03 PM  Clinical Narrative:  Readmission prevention screen complete. CSW met with patient. No supports at bedside. CSW introduced role and explained that discharge planning would be discussed. PCP is Myrtie Soman, MD at [facility]. Patient drives to his appointments. Pharmacy is [facility] in [place]. No issues obtaining medications. No home health or DME use prior to admission. Patient does report previous wheelchair use. No oxygen at home. He is currently on 3 L acute. Will follow for this potential discharge need. No further concerns. CSW encouraged patient to contact CSW as needed. CSW will continue to follow patient for support and facilitate return home when stable.               Expected Discharge Plan: Home/Self Care Barriers to Discharge: Continued Medical Work up   Patient Goals and CMS Choice        Expected Discharge Plan and Services Expected Discharge Plan: Home/Self Care     Post Acute Care Choice: Durable Medical Equipment (if needed) Living arrangements for the past 2 months: Single Family Home                                      Prior Living Arrangements/Services Living arrangements for the past 2 months: Single Family Home   Patient language and need for interpreter reviewed:: Yes Do you feel safe going back to the place where you live?: Yes      Need for Family Participation in Patient Care: Yes (Comment) Care giver support system in place?: Yes (comment)   Criminal Activity/Legal Involvement Pertinent to Current Situation/Hospitalization: No - Comment as needed  Activities of Daily Living Home Assistive Devices/Equipment: Gilford Rile (specify type) ADL Screening (condition at time of admission) Patient's  cognitive ability adequate to safely complete daily activities?: Yes Is the patient deaf or have difficulty hearing?: No Does the patient have difficulty seeing, even when wearing glasses/contacts?: No Does the patient have difficulty concentrating, remembering, or making decisions?: No Patient able to express need for assistance with ADLs?: Yes Does the patient have difficulty dressing or bathing?: Yes Independently performs ADLs?: Yes (appropriate for developmental age) Does the patient have difficulty walking or climbing stairs?: Yes Weakness of Legs: None Weakness of Arms/Hands: None  Permission Sought/Granted                  Emotional Assessment Appearance:: Appears stated age Attitude/Demeanor/Rapport: Engaged,Gracious Affect (typically observed): Accepting,Appropriate,Calm,Pleasant Orientation: : Oriented to Self,Oriented to Place,Oriented to  Time,Oriented to Situation Alcohol / Substance Use: Tobacco Use,Illicit Drugs Psych Involvement: No (comment)  Admission diagnosis:  Pneumonia [J18.9] Acute respiratory failure with hypoxia ([place]) [J96.01] Multifocal pneumonia [J18.9] Exacerbation of asthma, unspecified asthma severity, unspecified whether persistent [J45.901] Community acquired pneumonia, unspecified laterality [J18.9] Patient Active Problem List   Diagnosis Date Noted  . Acute respiratory failure with hypoxia ([place]) 04/07/2021  . Multifocal pneumonia 04/07/2021  . Asthma with severe exacerbation 04/07/2021  . Methadone maintenance therapy patient ([place]) 04/07/2021  . Obesity, Class III, BMI 40-49.9 (morbid obesity) ([place]) 04/07/2021  . DM (diabetes mellitus) ([place]) 04/07/2021  . Pneumonia 04/07/2021   PCP:  Myrtie Soman, MD Pharmacy:   [facility] [place], [place] -  [address] [place] [address] Phone: [phone] Fax: [phone]     Social Determinants  of Health ([place]) Interventions    Readmission Risk Interventions Readmission Risk Prevention Plan 04/08/2021  Transportation Screening Complete  PCP or Specialist Appt within 5-7 Days Complete  Medication Review (RN CM) Complete  Some recent data might be hidden

## 2021-04-08 NOTE — Consult Note (Signed)
Pulmonary Medicine          Date: 04/08/2021,   MRN# [id number] Stephen Rangel 27-Feb-1981     AdmissionWeight: 136.1 kg                 CurrentWeight: 136.1 kg   Referring physician: Dr Allena Katz   CHIEF COMPLAINT:   Acute hypoxemic respiratory failure   HISTORY OF PRESENT ILLNESS    He had atoothache and was in ER took narcotics for impacted tooth and shares he may have had narcotic overdose since he takes methadone too.  He shares he has recurrent vomiting and it due to bulemia. He admits to recurrent reglux and aspiration. He states this is a "male issues" and he has not spoken about this to anyone about this in the past.  He states he has had bulimia since he was a child and has kept it to himself all these years.  Patient reports history of asthma but is also lifelong smoker, currently smokes MJ and CBD. He admits to recurrent history with drugs previously liver disease with anasarca requiring hospitalization.  He comes in with acute hypoxemic respiratory failure and CT chest showing centrally localized interstitial airspace opacities with groundglass suggestive of pulmonary edema.  Procalcitonin is negative pointing away from acute bacterial pneumonia, his CBC does show mild leukocytosis of 16,000, however COVID-19 is negative, and we are running respiratory viral panel to check for other viral lower respiratory tract as etiology.  BNP is negative.  PAST MEDICAL HISTORY   Past Medical History:  Diagnosis Date  . Diabetes mellitus without complication (HCC)   . Liver disease      SURGICAL HISTORY   History reviewed. No pertinent surgical history.   FAMILY HISTORY   History reviewed. No pertinent family history.   SOCIAL HISTORY   Social History   Tobacco Use  . Smoking status: Current Every Day Smoker  . Smokeless tobacco: Never Used  Vaping Use  . Vaping Use: Never used     MEDICATIONS    Home Medication:    Current Medication:  Current  Facility-Administered Medications:  .  acetaminophen (TYLENOL) tablet 650 mg, 650 mg, Oral, Q4H PRN, Mansy, Jan A, MD, 650 mg at 04/07/21 2248 .  albuterol (PROVENTIL) (2.5 MG/3ML) 0.083% nebulizer solution 2.5 mg, 2.5 mg, Nebulization, Q2H PRN, Andris Baumann, MD, 2.5 mg at 04/08/21 0659 .  albuterol (VENTOLIN HFA) 108 (90 Base) MCG/ACT inhaler 1-2 puff, 1-2 puff, Inhalation, Q4H PRN, Enedina Finner, MD, 2 puff at 04/08/21 0222 .  azithromycin (ZITHROMAX) tablet 500 mg, 500 mg, Oral, Daily, Enedina Finner, MD, 500 mg at 04/08/21 0231 .  cefTRIAXone (ROCEPHIN) 1 g in sodium chloride 0.9 % 100 mL IVPB, 1 g, Intravenous, Q24H, Enedina Finner, MD, Held at 04/08/21 0226 .  clonazePAM (KLONOPIN) disintegrating tablet 0.25 mg, 0.25 mg, Oral, BID PRN, Mansy, Jan A, MD, 0.25 mg at 04/08/21 0305 .  enoxaparin (LOVENOX) injection 67.5 mg, 0.5 mg/kg, Subcutaneous, Q24H, Enedina Finner, MD, 67.5 mg at 04/07/21 0855 .  guaiFENesin (MUCINEX) 12 hr tablet 600 mg, 600 mg, Oral, BID, Enedina Finner, MD, 600 mg at 04/07/21 2248 .  insulin aspart (novoLOG) injection 0-20 Units, 0-20 Units, Subcutaneous, TID WC, Andris Baumann, MD, 7 Units at 04/07/21 1808 .  insulin aspart (novoLOG) injection 0-5 Units, 0-5 Units, Subcutaneous, QHS, Duncan, Hazel V, MD .  insulin aspart protamine- aspart (NOVOLOG MIX 70/30) injection 20 Units, 20 Units, Subcutaneous, BID WC, Enedina Finner, MD .  ipratropium-albuterol (DUONEB) 0.5-2.5 (3) MG/3ML nebulizer solution 3 mL, 3 mL, Nebulization, Q6H, Andris Baumann, MD, 3 mL at 04/07/21 2044 .  methadone (DOLOPHINE) 10 MG/ML solution 118 mg, 118 mg, Oral, Daily, Don Perking, [place], MD, 118 mg at 04/08/21 [phone] .  predniSONE (DELTASONE) tablet 50 mg, 50 mg, Oral, Q breakfast, Enedina Finner, MD .  protein supplement (ENSURE MAX) liquid, 11 oz, Oral, BID, Enedina Finner, MD, 11 oz at 04/07/21 2251    ALLERGIES   Patient has no known allergies.     REVIEW OF SYSTEMS    Review of Systems:  Gen:  Denies   fever, sweats, chills weigh loss  HEENT: Denies blurred vision, double vision, ear pain, eye pain, hearing loss, nose bleeds, sore throat Cardiac:  No dizziness, chest pain or heaviness, chest tightness,edema Resp:   Denies cough or sputum porduction, shortness of breath,wheezing, hemoptysis,  Gi: Denies swallowing difficulty, stomach pain, nausea or vomiting, diarrhea, constipation, bowel incontinence Gu:  Denies bladder incontinence, burning urine Ext:   Denies Joint pain, stiffness or swelling Skin: Denies  skin rash, easy bruising or bleeding or hives Endoc:  Denies polyuria, polydipsia , polyphagia or weight change Psych:   Denies depression, insomnia or hallucinations   Other:  All other systems negative   VS: BP (!) 144/73 (BP Location: Left Arm)   Pulse 84   Temp 98.5 F (36.9 C) (Oral)   Resp (!) 24   Ht 5\' 11"  (1.803 m)   Wt 136.1 kg   SpO2 96%   BMI 41.84 kg/m      PHYSICAL EXAM    GENERAL:NAD, no fevers, chills, no weakness no fatigue HEAD: Normocephalic, atraumatic.  EYES: Pupils equal, round, reactive to light. Extraocular muscles intact. No scleral icterus.  MOUTH: Moist mucosal membrane. Dentition intact. No abscess noted.  EAR, NOSE, THROAT: Clear without exudates. No external lesions.  NECK: Supple. No thyromegaly. No nodules. No JVD.  PULMONARY: Diffuse coarse rhonchi right sided +wheezes CARDIOVASCULAR: S1 and S2. Regular rate and rhythm. No murmurs, rubs, or gallops. No edema. Pedal pulses 2+ bilaterally.  GASTROINTESTINAL: Soft, nontender, nondistended. No masses. Positive bowel sounds. No hepatosplenomegaly.  MUSCULOSKELETAL: No swelling, clubbing, or edema. Range of motion full in all extremities.  NEUROLOGIC: Cranial nerves II through XII are intact. No gross focal neurological deficits. Sensation intact. Reflexes intact.  SKIN: No ulceration, lesions, rashes, or cyanosis. Skin warm and dry. Turgor intact.  PSYCHIATRIC: Mood, affect within normal  limits. The patient is awake, alert and oriented x 3. Insight, judgment intact.       IMAGING    DG Chest 2 View  Result Date: 04/05/2021 CLINICAL DATA:  Shortness of breath.  Productive cough. EXAM: CHEST - 2 VIEW COMPARISON:  March 19, 2019 FINDINGS: Patchy bilateral pulmonary infiltrates, particularly in the bases. The heart, hila, mediastinum, lungs, and pleura are otherwise unremarkable. Surgical hardware, pedicle rods and screws, are seen in the thoracolumbar spine. IMPRESSION: 1. Bi basilar patchy infiltrates most consistent with multifocal pneumonia. Recommend short-term follow-up imaging to ensure resolution. Electronically Signed   By: March 21, 2019 III M.D   On: 04/05/2021 13:22   CT Angio Chest PE W and/or Wo Contrast  Result Date: 04/07/2021 CLINICAL DATA:  PE suspected, low to intermediate probability, positive D-dimer EXAM: CT ANGIOGRAPHY CHEST WITH CONTRAST TECHNIQUE: Multidetector CT imaging of the chest was performed using the standard protocol during bolus administration of intravenous contrast. Multiplanar CT image reconstructions and MIPs were obtained to evaluate the vascular anatomy. CONTRAST:  100mL OMNIPAQUE IOHEXOL 350 MG/ML SOLN COMPARISON:  Radiographs 04/07/2021, 04/05/2021 FINDINGS: Cardiovascular: Borderline opacification of the pulmonary arteries. No central, lobar or proximal segmental filling defects are convincingly demonstrated. Top-normal heart size. No pericardial effusion. Coronary artery calcifications are present. Atherosclerotic plaque within the normal caliber aorta. No acute luminal abnormality of the imaged aorta. No periaortic stranding or hemorrhage. Normal 3 vessel branching of the aortic arch. Proximal great vessels are unremarkable. No major venous abnormalities or significant venous reflux. Mediastinum/Nodes: Scattered borderline enlarged mediastinal and hilar nodes are present for instance a 13 mm right paratracheal node (5/96), a 14 mm subcarinal  node (5/123), and a 14 mm right hilar node (5/119). No worrisome axillary adenopathy. Normal thyroid and thoracic inlet. No acute abnormality of the trachea or esophagus. Lungs/Pleura: Geographic regions of multifocal mixed ground-glass and consolidative opacity throughout both lungs. Findings on a background of mild paraseptal and centrilobular emphysematous change. Diffuse airways thickening and scattered secretions could reflect chronic bronchitic change or more acute airways inflammation. No pneumothorax or visible pleural effusion. No concerning pulmonary nodules or masses are seen within the limitations of underlying parenchymal disease Upper Abdomen: No acute abnormalities present in the visualized portions of the upper abdomen. Musculoskeletal: Multilevel degenerative changes are present in the imaged portions of the spine. Exaggerated thoracic kyphosis with left lateral fusion of the T10-T12 levels and a age-indeterminate compression deformity at L1 with up to 50% height loss anteriorly. 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 Straightening of the normal thoracic kyphosis elsewhere. Additional degenerative changes in the shoulders with surgical anchors along the inferior left glenoid. Review of the MIP images confirms the above findings. IMPRESSION: 1. Borderline opacification of the pulmonary arteries. No convincing pulmonary arterial filling defects are identified. 2. Widespread multifocal geographic regions of mixed consolidative and ground-glass opacity throughout both lungs concerning for a multifocal infectious process with a differential including atypical viral etiologies such as COVID-19. 3. Coronary artery atherosclerosis. Aortic Atherosclerosis (ICD10-I70.0). 4. Centrilobular and paraseptal emphysema ( Emphysema (ICD10-J43.9). 5. Age-indeterminate compression deformity at L1 with 50% height loss anteriorly. Correlate for focal tenderness to assess for acuity. 6. Focal kyphosis  across the laterally fused T10-T12 levels. Truncated appearance of the left ninth rib, possibly postsurgical change. Electronically Signed   By: Kreg ShropshirePrice  DeHay M.D.   On: 04/07/2021 02:33   DG Chest Portable 1 View  Result Date: 04/07/2021 CLINICAL DATA:  Shortness of breath, headache for 4 days, recently seen for the same symptoms and left AMA EXAM: PORTABLE CHEST 1 VIEW COMPARISON:  Radiograph 04/05/2021, CT 03/19/2019 FINDINGS: Multifocal consolidation and hazy interstitial opacities throughout the lungs. No pneumothorax or visible effusion. Slight prominence of the cardiomediastinal silhouette though may be accentuated by low volumes and portable technique. Remaining cardiomediastinal contours are unremarkable. Telemetry leads overlie the chest. Prior surgical repair of the left shoulder and thoracolumbar fusion are incompletely assessed on this exam. IMPRESSION: Appearance consistent with a multifocal pneumonia in the appropriate clinical setting, asymmetric edema less favored though may present similarly. Electronically Signed   By: Kreg ShropshirePrice  DeHay M.D.   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   Patient/Family are satisfied with care plan and all questions have been answered.  This document was prepared using Dragon voice recognition software and may include unintentional dictation errors.     Vida Rigger, M.D.  Division of Pulmonary & Critical Care Medicine  [facility]

## 2021-04-08 NOTE — Progress Notes (Addendum)
[facility]  - [place] [facility]   PATIENT NAME: Stephen Rangel    MR#:  [id number]  DATE OF BIRTH:  07-Aug-1981  SUBJECTIVE:   Patient came in with increasing shortness of breath and productive phlegm for last few days. Denies any recent travel or any recent illness. Started with some cold symptoms progress to increasing shortness of breath.   Patient has been descending into the upper 80s without oxygen readily hypoxia the bathroom. He is in denial in need for oxygen. He is anxious wants to go home. Seen by pulmonary today started on some IV Lasix for possible diuresis.  Wife at bedside. REVIEW OF SYSTEMS:   Review of Systems  Constitutional: Negative for chills, fever and weight loss.  HENT: Negative for ear discharge, ear pain and nosebleeds.   Eyes: Negative for blurred vision, pain and discharge.  Respiratory: Positive for cough and shortness of breath. Negative for sputum production, wheezing and stridor.   Cardiovascular: Negative for chest pain, palpitations, orthopnea and PND.  Gastrointestinal: Negative for abdominal pain, diarrhea, nausea and vomiting.  Genitourinary: Negative for frequency and urgency.  Musculoskeletal: Negative for back pain and joint pain.  Neurological: Negative for sensory change, speech change, focal weakness and weakness.  Psychiatric/Behavioral: Negative for depression and hallucinations. The patient is nervous/anxious and has insomnia.    Tolerating Diet:yes Tolerating PT: not needed  DRUG ALLERGIES:  No Known Allergies  VITALS:  Blood pressure 140/82, pulse 86, temperature 98.2 F (36.8 C), temperature source Oral, resp. rate 18, height 5\' 11"  (1.803 m), weight 136.1 kg, SpO2 94 %.  PHYSICAL EXAMINATION:   Physical Exam  GENERAL:  40 y.o.-year-old patient lying in the bed with no acute distress. Obese LUNGS: Normal breath sounds bilaterally, no wheezing, rales, rhonchi. No use of accessory muscles of respiration.   CARDIOVASCULAR: S1, S2 normal. No murmurs, rubs, or gallops.  ABDOMEN: Soft, nontender, nondistended. Bowel sounds present. No organomegaly or mass.  EXTREMITIES: No cyanosis, clubbing or edema b/l.    NEUROLOGIC: Cranial nerves II through XII are intact. No focal Motor or sensory deficits b/l.   PSYCHIATRIC:  patient is alert and oriented x 3.  SKIN: multiple skin tattoos. No rashes  LABORATORY PANEL:  CBC Recent Labs  Lab 04/06/21 0005  WBC 16.8*  HGB 13.1  HCT 39.2  PLT 176    Chemistries  Recent Labs  Lab 04/06/21 0005  NA 136  K 4.0  CL 104  CO2 24  GLUCOSE 223*  BUN 25*  CREATININE 0.52*  CALCIUM 8.4*  AST 38  ALT 39  ALKPHOS 84  BILITOT 0.5   Cardiac Enzymes No results for input(s): TROPONINI in the last 168 hours. RADIOLOGY:  CT Angio Chest PE W and/or Wo Contrast  Result Date: 04/07/2021 CLINICAL DATA:  PE suspected, low to intermediate probability, positive D-dimer EXAM: CT ANGIOGRAPHY CHEST WITH CONTRAST TECHNIQUE: Multidetector CT imaging of the chest was performed using the standard protocol during bolus administration of intravenous contrast. Multiplanar CT image reconstructions and MIPs were obtained to evaluate the vascular anatomy. CONTRAST:  06/07/2021 OMNIPAQUE IOHEXOL 350 MG/ML SOLN COMPARISON:  Radiographs 04/07/2021, 04/05/2021 FINDINGS: Cardiovascular: Borderline opacification of the pulmonary arteries. No central, lobar or proximal segmental filling defects are convincingly demonstrated. Top-normal heart size. No pericardial effusion. Coronary artery calcifications are present. Atherosclerotic plaque within the normal caliber aorta. No acute luminal abnormality of the imaged aorta. No periaortic stranding or hemorrhage. Normal 3 vessel branching of the aortic arch. Proximal great vessels  are unremarkable. No major venous abnormalities or significant venous reflux. Mediastinum/Nodes: Scattered borderline enlarged mediastinal and hilar nodes are present for  instance a 13 mm right paratracheal node (5/96), a 14 mm subcarinal node (5/123), and a 14 mm right hilar node (5/119). No worrisome axillary adenopathy. Normal thyroid and thoracic inlet. No acute abnormality of the trachea or esophagus. Lungs/Pleura: Geographic regions of multifocal mixed ground-glass and consolidative opacity throughout both lungs. Findings on a background of mild paraseptal and centrilobular emphysematous change. Diffuse airways thickening and scattered secretions could reflect chronic bronchitic change or more acute airways inflammation. No pneumothorax or visible pleural effusion. No concerning pulmonary nodules or masses are seen within the limitations of underlying parenchymal disease Upper Abdomen: No acute abnormalities present in the visualized portions of the upper abdomen. Musculoskeletal: Multilevel degenerative changes are present in the imaged portions of the spine. Exaggerated thoracic kyphosis with left lateral fusion of the T10-T12 levels and a age-indeterminate compression deformity at L1 with up to 50% height loss anteriorly. 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 Straightening of the normal thoracic kyphosis elsewhere. Additional degenerative changes in the shoulders with surgical anchors along the inferior left glenoid. Review of the MIP images confirms the above findings. IMPRESSION: 1. Borderline opacification of the pulmonary arteries. No convincing pulmonary arterial filling defects are identified. 2. Widespread multifocal geographic regions of mixed consolidative and ground-glass opacity throughout both lungs concerning for a multifocal infectious process with a differential including atypical viral etiologies such as COVID-19. 3. Coronary artery atherosclerosis. Aortic Atherosclerosis (ICD10-I70.0). 4. Centrilobular and paraseptal emphysema ( Emphysema (ICD10-J43.9). 5. Age-indeterminate compression deformity at L1 with 50% height loss anteriorly.  Correlate for focal tenderness to assess for acuity. 6. Focal kyphosis across the laterally fused T10-T12 levels. Truncated appearance of the left ninth rib, possibly postsurgical change. Electronically Signed   By: Kreg Shropshire M.D.   On: 04/07/2021 02:33   DG Chest Portable 1 View  Result Date: 04/07/2021 CLINICAL DATA:  Shortness of breath, headache for 4 days, recently seen for the same symptoms and left AMA EXAM: PORTABLE CHEST 1 VIEW COMPARISON:  Radiograph 04/05/2021, CT 03/19/2019 FINDINGS: Multifocal consolidation and hazy interstitial opacities throughout the lungs. No pneumothorax or visible effusion. Slight prominence of the cardiomediastinal silhouette though may be accentuated by low volumes and portable technique. Remaining cardiomediastinal contours are unremarkable. Telemetry leads overlie the chest. Prior surgical repair of the left shoulder and thoracolumbar fusion are incompletely assessed on this exam. IMPRESSION: Appearance consistent with a multifocal pneumonia in the appropriate clinical setting, asymmetric edema less favored though may present similarly. Electronically Signed   By: Kreg Shropshire M.D.   On: 04/07/2021 01:03   ASSESSMENT AND PLAN:   40 year old male with history of DM, asthma, class III obesity, on methadone maintenance therapy, presenting for the second time in 2 days with worsening shortness of breath, cough and myalgias.     Multifocal pneumonia appears atypical viral with possible pulmonary edema  Acute respiratory failure with hypoxia (HCC)  - Patient with increased work of breathing, speaking in short sentences, tachypneic, O2 sat 87% on room air requiring 3 L to maintain sats in the mid 90s - COVID PCR negative x2 on 5/1 and again on 5/2 - CTA chest negative for PE and showing multifocal pneumonia as would be seen in atypical viral pneumonia - Procalcitonin less than 0.1  -will continue azithromycin. -- Pulmonary consultation placed for Dr Karna Christmas--  feels this could be central pulmonary edema in the setting  of atypical lateral pneumonia. 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 He used to be on Victoza, metformin, insulin -- will start on insulin 7030 20 units BID  Polysubstance drug abuse history of hepatitis C chronic -- tobacco and smokes marijuana -- urine drug screen positive for marijuana and methadone (pt takes it) -- patient refuses psych consult.    DVT prophylaxis: Lovenox  Code Status: full code  Family Communication:   family at bedside Disposition Plan: Back to previous home environment Consults called: none  Status:At the time of admission, it appears that the appropriate admission status for this patient is INPATIENT  Level of care: Med-Surg Status is: Inpatient  Remains inpatient appropriate because:Inpatient level of care appropriate due to severity of illness   Dispo: The patient is from: Home              Anticipated d/c is to: Home              Patient currently is not medically stable to d/c.   Difficult to place patient No  Patient is anxious to go home. Explain him need to continue IV diuresis and assess for home oxygen need. Wife is at bedside.      TOTAL TIME TAKING CARE OF THIS PATIENT: 25 minutes.  >50% time spent on counselling and coordination of care  Note: This dictation was prepared with Dragon dictation along with smaller  phrase technology. Any transcriptional errors that result from this process are unintentional.  Enedina Finner M.D    [facility]   CC: Primary care physician; Filbert Berthold, MDPatient ID: Stephen Rangel, male   DOB: [birth year], 57 y.o.   MRN: [id number]

## 2021-04-08 NOTE — Progress Notes (Signed)
Patient able to stand and walk independently with walker.  Oxygen 94% on 3 L oxygen via nasal cannula.  Oxygen dropped to 86% on room air when walking in room and oxygen was replaced at 3L via nasal cannula.  Patient recovered to 94% on 3 L with deep breathing and resting in bed in couple minutes.

## 2021-04-09 DIAGNOSIS — J189 Pneumonia, unspecified organism: Secondary | ICD-10-CM | POA: Diagnosis not present

## 2021-04-09 LAB — GLUCOSE, CAPILLARY
Glucose-Capillary: 123 mg/dL — ABNORMAL HIGH (ref 70–99)
Glucose-Capillary: 147 mg/dL — ABNORMAL HIGH (ref 70–99)

## 2021-04-09 MED ORDER — METFORMIN HCL 500 MG PO TABS: 500.0000 mg | ORAL_TABLET | Freq: Two times a day (BID) | ORAL | Status: DC

## 2021-04-09 MED ORDER — METFORMIN HCL 500 MG PO TABS: 500.0000 mg | ORAL_TABLET | Freq: Two times a day (BID) | ORAL | 1 refills | Status: AC

## 2021-04-09 MED ORDER — GUAIFENESIN ER 600 MG PO TB12: 600.0000 mg | ORAL_TABLET | Freq: Two times a day (BID) | ORAL | 0 refills | Status: AC

## 2021-04-09 MED ORDER — ALBUTEROL SULFATE (2.5 MG/3ML) 0.083% IN NEBU: 2.5000 mg | INHALATION_SOLUTION | RESPIRATORY_TRACT | 12 refills | Status: AC | PRN

## 2021-04-09 MED ORDER — PREDNISONE 20 MG PO TABS: 40.0000 mg | ORAL_TABLET | Freq: Every day | ORAL | 0 refills | Status: AC

## 2021-04-09 NOTE — Discharge Instructions (Signed)
Use your oxygen, inhaler, nebulizer per instruction. Smoking cessation advised

## 2021-04-09 NOTE — Progress Notes (Signed)
Pulmonary Medicine          Date: 04/09/2021,   MRN# [id number] Stephen Rangel [birth year]     AdmissionWeight: 136.1 kg                 CurrentWeight: 136.1 kg   Referring physician: Dr Allena Katz   CHIEF COMPLAINT:   Acute hypoxemic respiratory failure   HISTORY OF PRESENT ILLNESS    He had atoothache and was in ER took narcotics for impacted tooth and shares he may have had narcotic overdose since he takes methadone too.  He shares he has recurrent vomiting and it due to bulemia. He admits to recurrent reglux and aspiration. He states this is a "male issues" and he has not spoken about this to anyone about this in the past.  He states he has had bulimia since he was a child and has kept it to himself all these years.  Patient reports history of asthma but is also lifelong smoker, currently smokes MJ and CBD. He admits to recurrent history with drugs previously liver disease with anasarca requiring hospitalization.  He comes in with acute hypoxemic respiratory failure and CT chest showing centrally localized interstitial airspace opacities with groundglass suggestive of pulmonary edema.  Procalcitonin is negative pointing away from acute bacterial pneumonia, his CBC does show mild leukocytosis of 16,000, however COVID-19 is negative, and we are running respiratory viral panel to check for other viral lower respiratory tract as etiology.  BNP is negative.  04/09/21- Patient reports clinical improvement, he is on 4-5L/min [place] due to viral pneumonia, but is improving. I discussed with him findings and plan.  He is pleasantly appreciative of care team.   PAST MEDICAL HISTORY   Past Medical History:  Diagnosis Date  . Diabetes mellitus without complication (HCC)   . Liver disease      SURGICAL HISTORY   History reviewed. No pertinent surgical history.   FAMILY HISTORY   History reviewed. No pertinent family history.   SOCIAL HISTORY   Social History   Tobacco Use  .  Smoking status: Current Every Day Smoker  . Smokeless tobacco: Never Used  Vaping Use  . Vaping Use: Never used     MEDICATIONS    Home Medication:    Current Medication:  Current Facility-Administered Medications:  .  acetaminophen (TYLENOL) tablet 650 mg, 650 mg, Oral, Q4H PRN, Mansy, Jan A, MD, 650 mg at 04/09/21 0908 .  albuterol (PROVENTIL) (2.5 MG/3ML) 0.083% nebulizer solution 2.5 mg, 2.5 mg, Nebulization, Q2H PRN, Andris Baumann, MD, 2.5 mg at 04/08/21 0659 .  albuterol (VENTOLIN HFA) 108 (90 Base) MCG/ACT inhaler 1-2 puff, 1-2 puff, Inhalation, Q4H PRN, Enedina Finner, MD, 2 puff at 04/09/21 0911 .  alum & mag hydroxide-simeth (MAALOX/MYLANTA) 200-200-20 MG/5ML suspension 30 mL, 30 mL, Oral, Q4H PRN, Enedina Finner, MD, 30 mL at 04/09/21 0921 .  enoxaparin (LOVENOX) injection 67.5 mg, 0.5 mg/kg, Subcutaneous, Q24H, Enedina Finner, MD, 67.5 mg at 04/09/21 2725 .  furosemide (LASIX) injection 40 mg, 40 mg, Intravenous, BID, Vida Rigger, MD, 40 mg at 04/09/21 0907 .  guaiFENesin (MUCINEX) 12 hr tablet 600 mg, 600 mg, Oral, BID, Enedina Finner, MD, 600 mg at 04/09/21 0908 .  insulin aspart (novoLOG) injection 0-20 Units, 0-20 Units, Subcutaneous, TID WC, Andris Baumann, MD, 3 Units at 04/09/21 [phone] .  insulin aspart (novoLOG) injection 0-5 Units, 0-5 Units, Subcutaneous, QHS, Para March, Hazel V, MD .  ipratropium-albuterol (DUONEB) 0.5-2.5 (3) MG/3ML nebulizer  solution 3 mL, 3 mL, Nebulization, Q6H, Andris Baumann, MD, 3 mL at 04/09/21 0833 .  metFORMIN (GLUCOPHAGE) tablet 500 mg, 500 mg, Oral, BID WC, Enedina Finner, MD .  methadone (DOLOPHINE) 10 MG/ML solution 118 mg, 118 mg, Oral, Daily, Don Perking, [place], MD, 118 mg at 04/09/21 0505 .  ondansetron [facility]) injection 4 mg, 4 mg, Intravenous, Q4H PRN, Mansy, Jan A, MD, 4 mg at 04/08/21 2021 .  predniSONE (DELTASONE) tablet 40 mg, 40 mg, Oral, Q breakfast, Colbie Sliker, MD, 40 mg at 04/09/21 0908 .  protein supplement (ENSURE MAX)  liquid, 11 oz, Oral, BID, Enedina Finner, MD, 11 oz at 04/09/21 0921 .  traZODone (DESYREL) tablet 25 mg, 25 mg, Oral, QHS PRN, Enedina Finner, MD, 25 mg at 04/08/21 2135    ALLERGIES   Patient has no known allergies.     REVIEW OF SYSTEMS    Review of Systems:  Gen:  Denies  fever, sweats, chills weigh loss  HEENT: Denies blurred vision, double vision, ear pain, eye pain, hearing loss, nose bleeds, sore throat Cardiac:  No dizziness, chest pain or heaviness, chest tightness,edema Resp:   Denies cough or sputum porduction, shortness of breath,wheezing, hemoptysis,  Gi: Denies swallowing difficulty, stomach pain, nausea or vomiting, diarrhea, constipation, bowel incontinence Gu:  Denies bladder incontinence, burning urine Ext:   Denies Joint pain, stiffness or swelling Skin: Denies  skin rash, easy bruising or bleeding or hives Endoc:  Denies polyuria, polydipsia , polyphagia or weight change Psych:   Denies depression, insomnia or hallucinations   Other:  All other systems negative   VS: BP 124/72 (BP Location: Left Arm)   Pulse 99   Temp 98.6 F (37 C) (Oral)   Resp 20   Ht 5\' 11"  (1.803 m)   Wt 136.1 kg   SpO2 94%   BMI 41.84 kg/m      PHYSICAL EXAM    GENERAL:NAD, no fevers, chills, no weakness no fatigue HEAD: Normocephalic, atraumatic.  EYES: Pupils equal, round, reactive to light. Extraocular muscles intact. No scleral icterus.  MOUTH: Moist mucosal membrane. Dentition intact. No abscess noted.  EAR, NOSE, THROAT: Clear without exudates. No external lesions.  NECK: Supple. No thyromegaly. No nodules. No JVD.  PULMONARY: Diffuse coarse rhonchi right sided +wheezes CARDIOVASCULAR: S1 and S2. Regular rate and rhythm. No murmurs, rubs, or gallops. No edema. Pedal pulses 2+ bilaterally.  GASTROINTESTINAL: Soft, nontender, nondistended. No masses. Positive bowel sounds. No hepatosplenomegaly.  MUSCULOSKELETAL: No swelling, clubbing, or edema. Range of motion full in  all extremities.  NEUROLOGIC: Cranial nerves II through XII are intact. No gross focal neurological deficits. Sensation intact. Reflexes intact.  SKIN: No ulceration, lesions, rashes, or cyanosis. Skin warm and dry. Turgor intact.  PSYCHIATRIC: Mood, affect within normal limits. The patient is awake, alert and oriented x 3. Insight, judgment intact.       IMAGING    DG Chest 2 View  Result Date: 04/05/2021 CLINICAL DATA:  Shortness of breath.  Productive cough. EXAM: CHEST - 2 VIEW COMPARISON:  March 19, 2019 FINDINGS: Patchy bilateral pulmonary infiltrates, particularly in the bases. The heart, hila, mediastinum, lungs, and pleura are otherwise unremarkable. Surgical hardware, pedicle rods and screws, are seen in the thoracolumbar spine. IMPRESSION: 1. Bi basilar patchy infiltrates most consistent with multifocal pneumonia. Recommend short-term follow-up imaging to ensure resolution. Electronically Signed   By: March 21, 2019 III M.D   On: 04/05/2021 13:22   CT Angio Chest PE W and/or Wo Contrast  Result Date: 04/07/2021 CLINICAL DATA:  PE suspected, low to intermediate probability, positive D-dimer EXAM: CT ANGIOGRAPHY CHEST WITH CONTRAST TECHNIQUE: Multidetector CT imaging of the chest was performed using the standard protocol during bolus administration of intravenous contrast. Multiplanar CT image reconstructions and MIPs were obtained to evaluate the vascular anatomy. CONTRAST:  OMNIPAQUE IOHEXOL 350 MG/ML SOLN COMPARISON:  Radiographs 04/07/2021, 04/05/2021 FINDINGS: Cardiovascular: Borderline opacification of the pulmonary arteries. No central, lobar or proximal segmental filling defects are convincingly demonstrated. Top-normal heart size. No pericardial effusion. Coronary artery calcifications are present. Atherosclerotic plaque within the normal caliber aorta. No acute luminal abnormality of the imaged aorta. No periaortic stranding or hemorrhage. Normal 3 vessel branching of the  aortic arch. Proximal great vessels are unremarkable. No major venous abnormalities or significant venous reflux. Mediastinum/Nodes: Scattered borderline enlarged mediastinal and hilar nodes are present for instance a 13 mm right paratracheal node (5/96), a 14 mm subcarinal node (5/123), and a 14 mm right hilar node (5/119). No worrisome axillary adenopathy. Normal thyroid and thoracic inlet. No acute abnormality of the trachea or esophagus. Lungs/Pleura: Geographic regions of multifocal mixed ground-glass and consolidative opacity throughout both lungs. Findings on a background of mild paraseptal and centrilobular emphysematous change. Diffuse airways thickening and scattered secretions could reflect chronic bronchitic change or more acute airways inflammation. No pneumothorax or visible pleural effusion. No concerning pulmonary nodules or masses are seen within the limitations of underlying parenchymal disease Upper Abdomen: No acute abnormalities present in the visualized portions of the upper abdomen. Musculoskeletal: Multilevel degenerative changes are present in the imaged portions of the spine. Exaggerated thoracic kyphosis with left lateral fusion of the T10-T12 levels and a age-indeterminate compression deformity at L1 with up to 50% height loss anteriorly. 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 Straightening of the normal thoracic kyphosis elsewhere. Additional degenerative changes in the shoulders with surgical anchors along the inferior left glenoid. Review of the MIP images confirms the above findings. IMPRESSION: 1. Borderline opacification of the pulmonary arteries. No convincing pulmonary arterial filling defects are identified. 2. Widespread multifocal geographic regions of mixed consolidative and ground-glass opacity throughout both lungs concerning for a multifocal infectious process with a differential including atypical viral etiologies such as COVID-19. 3. Coronary artery  atherosclerosis. Aortic Atherosclerosis (ICD10-I70.0). 4. Centrilobular and paraseptal emphysema ( Emphysema (ICD10-J43.9). 5. Age-indeterminate compression deformity at L1 with 50% height loss anteriorly. Correlate for focal tenderness to assess for acuity. 6. Focal kyphosis across the laterally fused T10-T12 levels. Truncated appearance of the left ninth rib, possibly postsurgical change. Electronically Signed   By: Kreg Shropshire M.D.   On: 04/07/2021 02:33   DG Chest Portable 1 View  Result Date: 04/07/2021 CLINICAL DATA:  Shortness of breath, headache for 4 days, recently seen for the same symptoms and left AMA EXAM: PORTABLE CHEST 1 VIEW COMPARISON:  Radiograph 04/05/2021, CT 03/19/2019 FINDINGS: Multifocal consolidation and hazy interstitial opacities throughout the lungs. No pneumothorax or visible effusion. Slight prominence of the cardiomediastinal silhouette though may be accentuated by low volumes and portable technique. Remaining cardiomediastinal contours are unremarkable. Telemetry leads overlie the chest. Prior surgical repair of the left shoulder and thoracolumbar fusion are incompletely assessed on this exam. IMPRESSION: Appearance consistent with a multifocal pneumonia in the appropriate clinical setting, asymmetric edema less favored though may present similarly. Electronically Signed   By: Kreg Shropshire M.D.   On: 04/07/2021 01:03      ASSESSMENT/PLAN   Acute hypoxemic respiratory failure -mild leukocytososis - due  [facility] - NOT COVID[facility]   - central predominant GGO bilaterally suggestive of pulm edema-he made 3L of urine overnight and states he poured a whole bucket out into toilet so he may need more diuresis still , we can monitor renal function.  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   Patient/Family are satisfied with care plan and all questions have been answered.  This document was prepared using Dragon voice recognition software and may include unintentional dictation errors.     Vida RiggerFuad Kami Kube, M.D.  Division of Pulmonary & Critical Care Medicine  [facility]

## 2021-04-09 NOTE — TOC Progression Note (Signed)
Transition of Care [facility]) - Progression Note    Patient Details  Name: Stephen Rangel MRN: [id number] Date of Birth: 1980/12/14  [facility]) CM/SW Contact  Margarito Liner, LCSW Phone Number: 04/09/2021, 10:12 AM  Clinical Narrative:  Ordered home oxygen through [employer]. Representative said that since he is now requiring 4 L instead of 3 which he was on yesterday, he will need a new sats note. Sent secure chat to RN with request.  Expected Discharge Plan: Home/Self Care Barriers to Discharge: Continued Medical Work up  Expected Discharge Plan and Services Expected Discharge Plan: Home/Self Care     Post Acute Care Choice: Durable Medical Equipment (if needed) Living arrangements for the past 2 months: Single Family Home Expected Discharge Date: 04/09/21                                     Social Determinants of Health (SDOH) Interventions    Readmission Risk Interventions Readmission Risk Prevention Plan 04/08/2021  Transportation Screening Complete  PCP or Specialist Appt within 5-7 Days Complete  Medication Review (RN CM) Complete  Some recent data might be hidden

## 2021-04-09 NOTE — Plan of Care (Signed)
  Problem: Clinical Measurements: Goal: Ability to maintain clinical measurements within normal limits will improve Outcome: Progressing Goal: Will remain free from infection Outcome: Progressing Goal: Diagnostic test results will improve Outcome: Progressing Goal: Respiratory complications will improve Outcome: Progressing Goal: Cardiovascular complication will be avoided Outcome: Progressing   Problem: Pain Managment: Goal: General experience of comfort will improve Outcome: Progressing   Pt is involved in and agrees with the plan of care. V/s stable. Complained of shortness of breath and nausea last night. Inhaler and Zofran given. Sched neb tx given. Oxygen sat at 91-95%. Lung sounds has wheezes and has productive cough.

## 2021-04-09 NOTE — Discharge Summary (Signed)
[facility] [place] [facility]   PATIENT NAME: Stephen Rangel    MR#:  [id number]  DATE OF BIRTH:  07/03/1981  DATE OF ADMISSION:  04/06/2021 ADMITTING PHYSICIAN: Enedina FinnerSona Jacaden Forbush, MD  DATE OF DISCHARGE: 04/09/2021  PRIMARY CARE PHYSICIAN: Filbert BertholdSatter, Jane, MD    ADMISSION DIAGNOSIS:  Pneumonia [J18.9] Acute respiratory failure with hypoxia (HCC) [J96.01] Multifocal pneumonia [J18.9] Exacerbation of asthma, unspecified asthma severity, unspecified whether persistent [J45.901] Community acquired pneumonia, unspecified laterality [J18.9]  DISCHARGE DIAGNOSIS:  Acute hypoxic respiratory failure secondary to multifocal viral pneumonia due to [facility] OC 43 species  SECONDARY DIAGNOSIS:   Past Medical History:  Diagnosis Date  . Diabetes mellitus without complication (HCC)   . Liver disease     HOSPITAL COURSE:  40 year old male with history ofDM, asthma, class III obesity, on methadone maintenance therapy,presenting for the second time in 2 days with worsening shortness of breath, cough and myalgias.  Multifocal pneumonia due to [facility] species OC43 with possible pulmonary edema Acute respiratory failure with hypoxia (HCC) -Patient with increased work of breathing, speaking in short sentences, tachypneic, O2 sat 87% on room air requiring 4 L to maintain sats in the mid 90s --clinically improving -COVID PCR negative x2 on 5/1 and again on 5/2 -CTA chest negative for PE and showing multifocal pneumonia as would be seen in atypical viral pneumonia -Procalcitonin less than 0.1  -- Pulmonary consultation placed for Dr Karna ChristmasALeskerov-- feels this could be central pulmonary edema in the setting of atypical lateral pneumonia. 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 He used to be on Victoza, metformin, insulin -- A1c 6.1%--will give metformin 500 mg bid  Polysubstance drug abuse history of hepatitis C chronic -- tobacco and smokes marijuana -- urine drug screen positive for marijuana and methadone (pt takes it) -- patient refuses psych consult.  Discharge to home with outpatient follow-up PCP Dr. Epimenio FootSater and pulmonology. Patient will be discharged home on oxygen, nebulizer.   DVT prophylaxis:Lovenox  Code Status:full code Family Communication: family at bedside Disposition Plan:Back to previous home environment Consults called:none StatusINPATIENT  Level of care: Med-Surg Status is: Inpatient    Dispo: The patient is from: Home  Anticipated d/c is to: Home  Patient currently is medically optimized and  stable to d/c.              Difficult to place patient No    CONSULTS OBTAINED:  Treatment Team:  Vida RiggerAleskerov, Fuad, MD  DRUG ALLERGIES:  No Known Allergies  DISCHARGE MEDICATIONS:   Allergies as of 04/09/2021   No Known Allergies     Medication List    STOP taking these medications   doxycycline 100 MG capsule Commonly known as: VIBRAMYCIN     TAKE these medications   albuterol 108 (90 Base) MCG/ACT inhaler Commonly known as: VENTOLIN HFA Inhale 2 puffs into the lungs every 6 (six) hours as needed for up to 1 dose for wheezing or shortness of breath. What changed: Another medication with the same name was added. Make sure you  understand how and when to take each.   albuterol  (2.5 MG/3ML) 0.083% nebulizer solution Commonly known as: PROVENTIL Take 3 mLs (2.5 mg total) by nebulization every 2 (two) hours as needed for shortness of breath. What changed: You were already taking a medication with the same name, and this prescription was added. Make sure you understand how and when to take each.   guaiFENesin 600 MG 12 hr tablet Commonly known as: MUCINEX Take 1 tablet (600 mg total) by mouth 2 (two) times daily for 7 days.   metFORMIN 500 MG tablet Commonly known as: GLUCOPHAGE Take 1 tablet (500 mg total) by mouth 2 (two) times daily with a meal.   methadone 10 MG/ML solution Commonly known as: DOLOPHINE Take 118 mg by mouth every morning.   predniSONE 20 MG tablet Commonly known as: DELTASONE Take 2 tablets (40 mg total) by mouth daily with breakfast. Take 40 mg daily for 4 days then take 20 mg po daily for 4 days and stop Start taking on: Apr 10, 2021 What changed:  medication strength when to take this additional instructions            Durable Medical Equipment  (From admission, onward)         Start     Ordered   04/09/21 1002  For home use only DME Nebulizer machine  Once       Question Answer Comment  Patient needs a nebulizer to treat with the following condition COPD (chronic obstructive pulmonary disease) (HCC)   Length of Need Lifetime      04/09/21 1002   04/09/21 0956  For home use only DME oxygen  Once       Question Answer Comment  Length of Need 6 Months   Mode or (Route) Nasal cannula   Liters per Minute 4   Frequency Continuous (stationary and portable oxygen unit needed)   Oxygen conserving device Yes   Oxygen delivery system Gas      04/09/21 0955          If you experience worsening of your admission symptoms, develop shortness of breath, life threatening emergency, suicidal or homicidal thoughts you must seek medical attention immediately by calling 911 or calling your MD immediately  if symptoms less  severe.  You Must read complete instructions/literature along with all the possible adverse reactions/side effects for all the Medicines you take and that have been prescribed to you. Take any new Medicines after you have completely understood and accept all the possible adverse reactions/side effects.   Please note  You were cared for by a hospitalist during your hospital stay. If you have any questions about your discharge medications or the care you received while you were in the hospital after you are discharged, you can call the unit and asked to speak with the hospitalist on call if the hospitalist that took care of you is not available. Once you are discharged, your primary care physician will handle any further medical issues. Please note that NO REFILLS for any discharge medications will be authorized once you are discharged, as it is imperative that you return to your primary care physician (or establish a relationship with a primary care physician if you do not have one) for your aftercare needs so that they can reassess your need for medications and monitor your lab values. Today   SUBJECTIVE   Overall improving. Requiring oxygen about 4 L to keep sats greater than 90%. No other issues. Patient  wants to go home  VITAL SIGNS:  Blood pressure 124/72, pulse 99, temperature 98.6 F (37 C), temperature source Oral, resp. rate 20, height 5\' 11"  (1.803 m), weight 136.1 kg, SpO2 94 %.  I/O:    Intake/Output Summary (Last 24 hours) at 04/09/2021 1009 Last data filed at 04/09/2021 0146 Gross per 24 hour  Intake 0 ml  Output 2325 ml  Net -2325 ml    PHYSICAL EXAMINATION:   GENERAL:  40 y.o.-year-old patient lying in the bed with no acute distress. Obese LUNGS: decreased breath sounds bilaterally, no wheezing, rales, rhonchi. No use of accessory muscles of respiration.  CARDIOVASCULAR: S1, S2 normal. No murmurs, rubs, or gallops.  ABDOMEN: Soft, nontender, nondistended. Bowel sounds  present. No organomegaly or mass.  EXTREMITIES: No cyanosis, clubbing or edema b/l.    NEUROLOGIC: Cranial nerves II through XII are intact. No focal Motor or sensory deficits b/l.   PSYCHIATRIC:  patient is alert and oriented x 3.  SKIN: multiple skin tattoos. No rashes DATA REVIEW:   CBC  Recent Labs  Lab 04/06/21 0005  WBC 16.8*  HGB 13.1  HCT 39.2  PLT 176    Chemistries  Recent Labs  Lab 04/06/21 0005  NA 136  K 4.0  CL 104  CO2 24  GLUCOSE 223*  BUN 25*  CREATININE 0.52*  CALCIUM 8.4*  AST 38  ALT 39  ALKPHOS 84  BILITOT 0.5    Microbiology Results   Recent Results (from the past 240 hour(s))  Resp Panel by RT-PCR (Flu A&B, Covid) Nasopharyngeal Swab     Status: None   Collection Time: 04/05/21  1:56 PM   Specimen: Nasopharyngeal Swab; Nasopharyngeal(NP) swabs in vial transport medium  Result Value Ref Range Status   SARS Coronavirus 2 by RT PCR NEGATIVE NEGATIVE Final    Comment: (NOTE) SARS-CoV-2 target nucleic acids are NOT DETECTED.  The SARS-CoV-2 RNA is generally detectable in upper respiratory specimens during the acute phase of infection. The lowest concentration of SARS-CoV-2 viral copies this assay can detect is 138 copies/mL. A negative result does not preclude SARS-Cov-2 infection and should not be used as the sole basis for treatment or other patient management decisions. A negative result may occur with  improper specimen collection/handling, submission of specimen other than nasopharyngeal swab, presence of viral mutation(s) within the areas targeted by this assay, and inadequate number of viral copies(<138 copies/mL). A negative result must be combined with clinical observations, patient history, and epidemiological information. The expected result is Negative.  Fact Sheet for Patients:  04/07/21  Fact Sheet for Healthcare Providers:  BloggerCourse.com  This test is no t yet  approved or cleared by the [place] FDA and  has been authorized for detection and/or diagnosis of SARS-CoV-2 by FDA under an Emergency Use Authorization (EUA). This EUA will remain  in effect (meaning this test can be used) for the duration of the COVID-19 declaration under Section 564(b)(1) of the Act, 21 U.S.C.section 360bbb-3(b)(1), unless the authorization is terminated  or revoked sooner.       Influenza A by PCR NEGATIVE NEGATIVE Final   Influenza B by PCR NEGATIVE NEGATIVE Final    Comment: (NOTE) The Xpert Xpress SARS-CoV-2/FLU/RSV plus assay is intended as an aid in the diagnosis of influenza from Nasopharyngeal swab specimens and should not be used as a sole basis for treatment. Nasal washings and aspirates are unacceptable for Xpert Xpress SARS-CoV-2/FLU/RSV testing.  Fact Sheet for Patients: [place]  Fact Sheet for Healthcare  Providers: SeriousBroker.it  This test is not yet approved or cleared by the [place] and has been authorized for detection and/or diagnosis of SARS-CoV-2 by FDA under an Emergency Use Authorization (EUA). This EUA will remain in effect (meaning this test can be used) for the duration of the COVID-19 declaration under Section 564(b)(1) of the Act, 21 U.S.C. section 360bbb-3(b)(1), unless the authorization is terminated or revoked.  Performed at [facility], [address] [place], [place] [address]   Resp Panel by RT-PCR (Flu A&B, Covid) Nasopharyngeal Swab     Status: None   Collection Time: 04/07/21  2:43 AM   Specimen: Nasopharyngeal Swab; Nasopharyngeal(NP) swabs in vial transport medium  Result Value Ref Range Status   SARS Coronavirus 2 by RT PCR NEGATIVE NEGATIVE Final    Comment: (NOTE) SARS-CoV-2 target nucleic acids are NOT DETECTED.  The SARS-CoV-2 RNA is generally detectable in upper respiratory specimens during the acute phase of infection.  The lowest concentration of SARS-CoV-2 viral copies this assay can detect is 138 copies/mL. A negative result does not preclude SARS-Cov-2 infection and should not be used as the sole basis for treatment or other patient management decisions. A negative result may occur with  improper specimen collection/handling, submission of specimen other than nasopharyngeal swab, presence of viral mutation(s) within the areas targeted by this assay, and inadequate number of viral copies(<138 copies/mL). A negative result must be combined with clinical observations, patient history, and epidemiological information. The expected result is Negative.  Fact Sheet for Patients:  BloggerCourse.com  Fact Sheet for Healthcare Providers:  SeriousBroker.it  This test is no t yet approved or cleared by the [place] FDA and  has been authorized for detection and/or diagnosis of SARS-CoV-2 by FDA under an Emergency Use Authorization (EUA). This EUA will remain  in effect (meaning this test can be used) for the duration of the COVID-19 declaration under Section 564(b)(1) of the Act, 21 U.S.C.section 360bbb-3(b)(1), unless the authorization is terminated  or revoked sooner.       Influenza A by PCR NEGATIVE NEGATIVE Final   Influenza B by PCR NEGATIVE NEGATIVE Final    Comment: (NOTE) The Xpert Xpress SARS-CoV-2/FLU/RSV plus assay is intended as an aid in the diagnosis of influenza from Nasopharyngeal swab specimens and should not be used as a sole basis for treatment. Nasal washings and aspirates are unacceptable for Xpert Xpress SARS-CoV-2/FLU/RSV testing.  Fact Sheet for Patients: BloggerCourse.com  Fact Sheet for Healthcare Providers: SeriousBroker.it  This test is not yet approved or cleared by the [place] FDA and has been authorized for detection and/or diagnosis of SARS-CoV-2 by FDA  under an Emergency Use Authorization (EUA). This EUA will remain in effect (meaning this test can be used) for the duration of the COVID-19 declaration under Section 564(b)(1) of the Act, 21 U.S.C. section 360bbb-3(b)(1), unless the authorization is terminated or revoked.  Performed at [facility], [address] [place], [place] [address]   Respiratory (~20 pathogens) panel by PCR     Status: Abnormal   Collection Time: 04/08/21 12:14 PM   Specimen: Nasopharyngeal Swab; Respiratory  Result Value Ref Range Status   Adenovirus NOT DETECTED NOT DETECTED Final   Coronavirus 229E NOT DETECTED NOT DETECTED Final    Comment: (NOTE) The Coronavirus on the Respiratory Panel, DOES NOT test for the novel  Coronavirus (2019 nCoV)    Coronavirus HKU1 NOT DETECTED NOT DETECTED Final   Coronavirus NL63 NOT DETECTED NOT DETECTED Final  Coronavirus OC43 DETECTED (A) NOT DETECTED Final   Metapneumovirus NOT DETECTED NOT DETECTED Final   Rhinovirus / Enterovirus NOT DETECTED NOT DETECTED Final   Influenza A NOT DETECTED NOT DETECTED Final   Influenza B NOT DETECTED NOT DETECTED Final   Parainfluenza Virus 1 NOT DETECTED NOT DETECTED Final   Parainfluenza Virus 2 NOT DETECTED NOT DETECTED Final   Parainfluenza Virus 3 NOT DETECTED NOT DETECTED Final   Parainfluenza Virus 4 NOT DETECTED NOT DETECTED Final   Respiratory Syncytial Virus NOT DETECTED NOT DETECTED Final   Bordetella pertussis NOT DETECTED NOT DETECTED Final   Bordetella Parapertussis NOT DETECTED NOT DETECTED Final   Chlamydophila pneumoniae NOT DETECTED NOT DETECTED Final   Mycoplasma pneumoniae NOT DETECTED NOT DETECTED Final    Comment: Performed at [facility], [address] [place], [place] [address]    RADIOLOGY:  No results found.   CODE STATUS:     Code Status Orders  (From admission, onward)         Start     Ordered   04/07/21 0333  Full code  Continuous        04/07/21 0335        Code  Status History    This patient has a current code status but no historical code status.   Advance Care Planning Activity       TOTAL TIME TAKING CARE OF THIS PATIENT: *40* minutes.    Enedina Finner M.D  [facility]    CC: Primary care physician; Filbert Berthold, MD

## 2021-04-09 NOTE — Progress Notes (Signed)
SATURATION QUALIFICATIONS:   Patient Saturations on Room Air at Rest = 80%  Patient Saturations on Room Air while Ambulating = 83%  Patient Saturations on 4Liters of oxygen while Ambulating = 99%   Patient has shortness of breath on room air. o2 sats drop without oxygen. Patient requires 4 liters of oxygen continously.

## 2021-04-09 NOTE — TOC Transition Note (Signed)
Transition of Care [facility]) - CM/SW Discharge Note   Patient Details  Name: LUCIOUS ZOU MRN: [id number] Date of Birth: [birth year]  Transition of Care [facility]) CM/SW Contact:  Margarito Liner, LCSW Phone Number: 04/09/2021, 11:21 AM   Clinical Narrative: Patient has orders to discharge home today. Oxygen and nebulizer machine have been ordered. No further concerns. CSW signing off.    Final next level of care: Home/Self Care Barriers to Discharge: Barriers Resolved   Patient Goals and CMS Choice        Discharge Placement                    Patient and family notified of of transfer: 04/09/21  Discharge Plan and Services     Post Acute Care Choice: Durable Medical Equipment (if needed)          DME Arranged: Oxygen,Nebulizer machine DME Agency: [employer] Date DME Agency Contacted: 04/09/21   Representative spoke with at DME Agency: Bjorn Loser            Social Determinants of Health (SDOH) Interventions     Readmission Risk Interventions Readmission Risk Prevention Plan 04/08/2021  Transportation Screening Complete  PCP or Specialist Appt within 5-7 Days Complete  Medication Review (RN CM) Complete  Some recent data might be hidden

## 2022-11-13 IMAGING — DX DG CHEST 1V PORT
1 series · 1 of 1 positions shown · non-contrast
Comparison: Radiograph 04/05/2021, CT 03/19/2019

CLINICAL DATA: Shortness of breath, headache for 4 days, recently
seen for the same symptoms and left AMA

EXAM:
PORTABLE CHEST 1 VIEW

[chest ap]
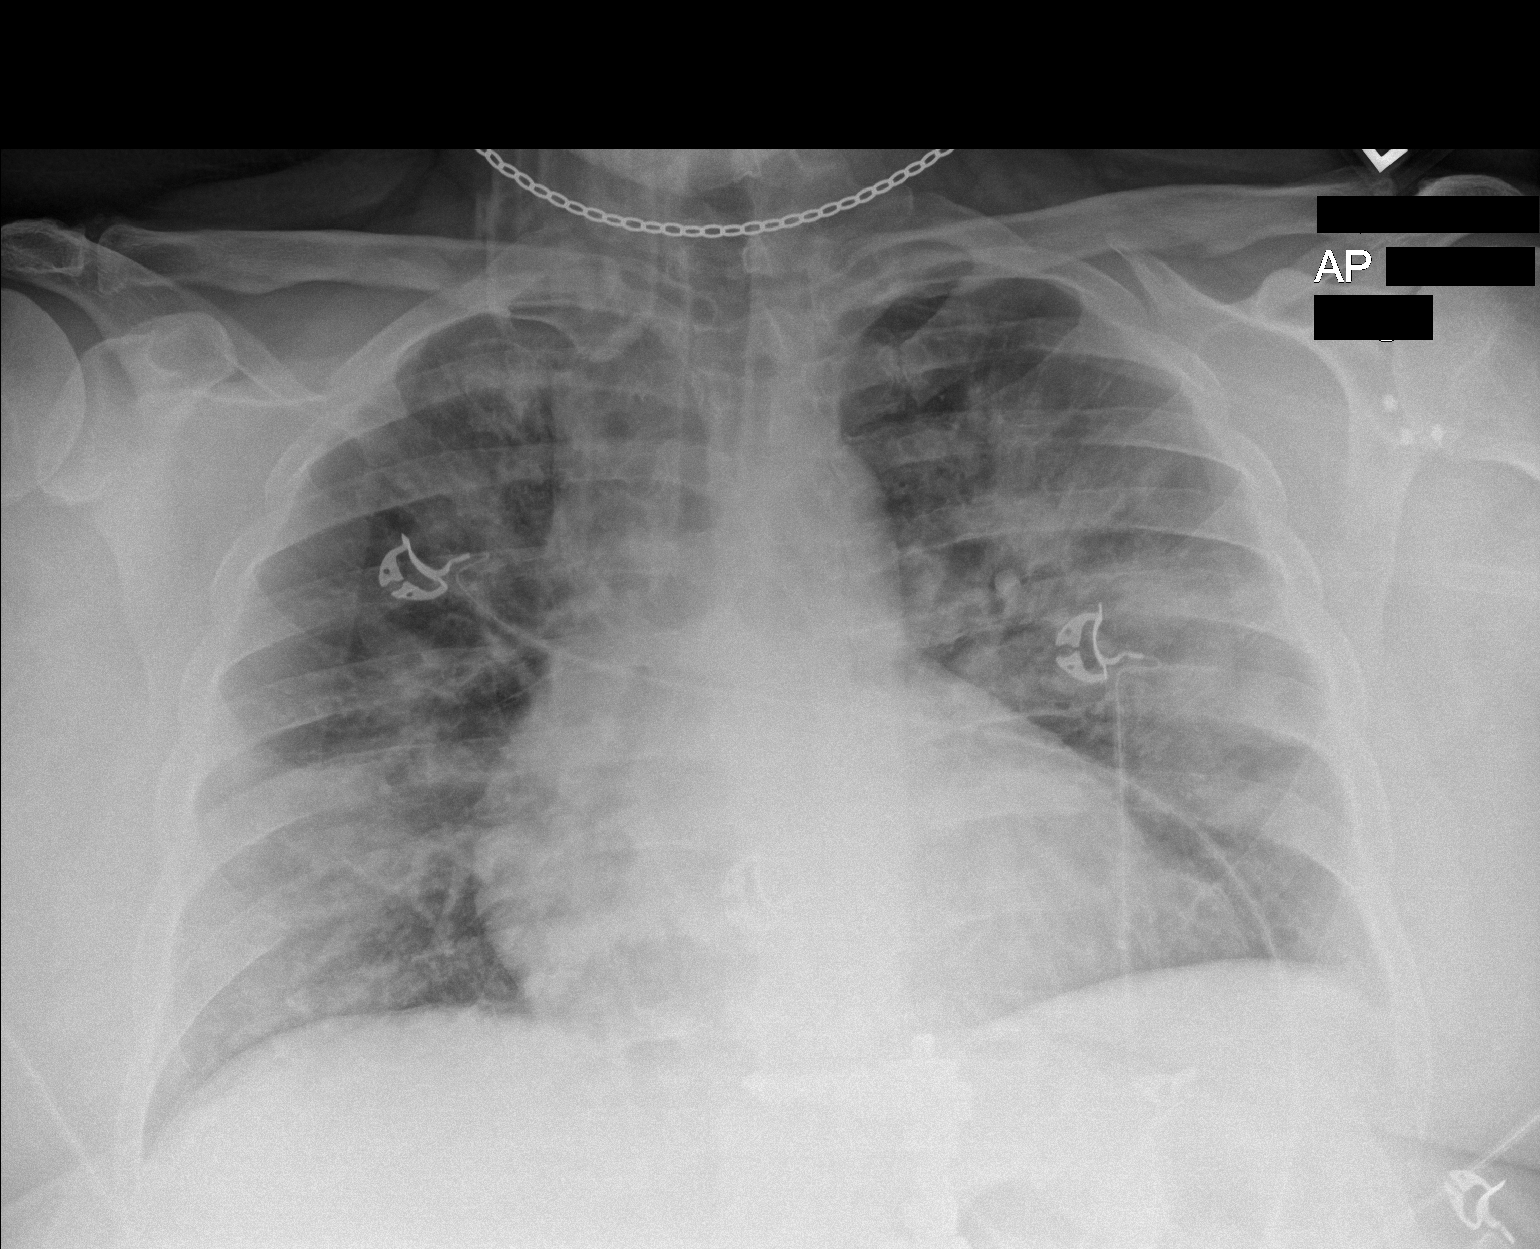

[1 of 1 positions shown; findings below may reference images not displayed]

FINDINGS: Multifocal consolidation and hazy interstitial opacities throughout
the lungs. No pneumothorax or visible effusion. Slight prominence of
the cardiomediastinal silhouette though may be accentuated by low
volumes and portable technique. Remaining cardiomediastinal contours
are unremarkable. Telemetry leads overlie the chest. Prior surgical
repair of the left shoulder and thoracolumbar fusion are
incompletely assessed on this exam.
IMPRESSION: Appearance consistent with a multifocal pneumonia in the appropriate
clinical setting, asymmetric edema less favored though may present
similarly.

## 2022-11-14 ENCOUNTER — Emergency Department: Payer: Medicaid Other

## 2022-11-14 ENCOUNTER — Other Ambulatory Visit: Payer: Self-pay

## 2022-11-14 ENCOUNTER — Emergency Department
Admission: EM | Admit: 2022-11-14 | Discharge: 2022-11-14 | Discharge status: Against Medical Advice | Payer: Medicaid Other | Attending: Emergency Medicine | Admitting: Emergency Medicine

## 2022-11-14 DIAGNOSIS — Z5321 Procedure and treatment not carried out due to patient leaving prior to being seen by health care provider: Secondary | ICD-10-CM | POA: Diagnosis not present

## 2022-11-14 DIAGNOSIS — I10 Essential (primary) hypertension: Secondary | ICD-10-CM | POA: Diagnosis not present

## 2022-11-14 DIAGNOSIS — R569 Unspecified convulsions: Secondary | ICD-10-CM | POA: Diagnosis not present

## 2022-11-14 DIAGNOSIS — E119 Type 2 diabetes mellitus without complications: Secondary | ICD-10-CM | POA: Insufficient documentation

## 2022-11-14 DIAGNOSIS — M79606 Pain in leg, unspecified: Secondary | ICD-10-CM | POA: Insufficient documentation

## 2022-11-14 HISTORY — DX: Essential (primary) hypertension: I10
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
## 2022-11-14 NOTE — ED Triage Notes (Signed)
Pt does not wish to have labs collected or EKG performed. Triage provider aware. Explained to pt that it would be difficult to treat him without testing, pt verbalizes understanding and states he just does not want it done

## 2022-11-14 NOTE — ED Triage Notes (Signed)
Pt called from [facility] to treatment room, no response 

## 2022-11-14 NOTE — ED Notes (Signed)
Called x 2 no answer
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
## 2023-02-27 ENCOUNTER — Emergency Department: Payer: Medicaid Other

## 2023-02-27 ENCOUNTER — Emergency Department
Admission: EM | Admit: 2023-02-27 | Discharge: 2023-02-27 | Disposition: A | Discharge status: Home / Self Care | Payer: Medicaid Other | Attending: Emergency Medicine | Admitting: Emergency Medicine

## 2023-02-27 ENCOUNTER — Other Ambulatory Visit: Payer: Self-pay

## 2023-02-27 ENCOUNTER — Encounter: Payer: Self-pay | Admitting: Emergency Medicine

## 2023-02-27 DIAGNOSIS — R030 Elevated blood-pressure reading, without diagnosis of hypertension: Secondary | ICD-10-CM | POA: Diagnosis present

## 2023-02-27 DIAGNOSIS — I10 Essential (primary) hypertension: Secondary | ICD-10-CM | POA: Diagnosis not present

## 2023-02-27 DIAGNOSIS — E119 Type 2 diabetes mellitus without complications: Secondary | ICD-10-CM | POA: Insufficient documentation

## 2023-02-27 LAB — BASIC METABOLIC PANEL
Anion gap: 8 (ref 5–15)
BUN: 11 mg/dL (ref 6–20)
CO2: 25 mmol/L (ref 22–32)
Calcium: 8.7 mg/dL — ABNORMAL LOW (ref 8.9–10.3)
Chloride: 104 mmol/L (ref 98–111)
Creatinine, Ser: 0.6 mg/dL — ABNORMAL LOW (ref 0.61–1.24)
GFR, Estimated: >60 mL/min (ref >60)
Glucose, Bld: 109 mg/dL — ABNORMAL HIGH (ref 70–99)
Potassium: 3.8 mmol/L (ref 3.5–5.1)
Sodium: 137 mmol/L (ref 135–145)

## 2023-02-27 LAB — CBC
HCT: 41.1 % (ref 39.0–52.0)
Hemoglobin: 13.1 g/dL (ref 13.0–17.0)
MCH: 25.6 pg — ABNORMAL LOW (ref 26.0–34.0)
MCHC: 31.9 g/dL (ref 30.0–36.0)
MCV: 80.3 fL (ref 80.0–100.0)
Platelets: 232 10*3/uL (ref 150–400)
RBC: 5.12 MIL/uL (ref 4.22–5.81)
RDW: 15.9 % — ABNORMAL HIGH (ref 11.5–15.5)
WBC: 7.8 10*3/uL (ref 4.0–10.5)
nRBC: 0 % (ref 0.0–0.2)

## 2023-02-27 NOTE — ED Provider Notes (Signed)
[facility] Provider Note    Event Date/Time   First MD Initiated Contact with Patient 02/27/23 1057     (approximate)   History   Medical Clearance and Hypertension   HPI  Stephen Rangel is a 42 y.o. male with a history of diabetes, hypertension who presents for medical clearance.  Patient is in custody with police, complained of high blood pressure so brought here for evaluation.  He denies neurodeficits to me.  Moving all extremities well.  He does complain of feeling somewhat dizzy.  Prior to being seen the patient was handcuffed to wheelchair, somehow fell out of wheelchair onto the side.  He denies bony injuries but thinks he that he may have hit his head.  Denies neck pain, no back pain, no abdominal pain or chest pain.     Physical Exam   Triage Vital Signs: ED Triage Vitals  Enc Vitals Group     BP 02/27/23 1042 (!) 178/88     Pulse Rate 02/27/23 1042 89     Resp 02/27/23 1042 20     Temp 02/27/23 1042 98.4 F (36.9 C)     Temp src --      SpO2 02/27/23 1042 98 %     Weight 02/27/23 1039 122.5 kg (270 lb)     Height 02/27/23 1039 1.803 m (5\' 11" )     Head Circumference --      Peak Flow --      Pain Score --      Pain Loc --      Pain Edu? --      Excl. in [place]? --     Most recent vital signs: Vitals:   02/27/23 1042  BP: (!) 178/88  Pulse: 89  Resp: 20  Temp: 98.4 F (36.9 C)  SpO2: 98%     General: Awake, no distress.  CV:  Good peripheral perfusion.  Regular rate and rhythm Resp:  Normal effort.  Abd:  No distention.  Soft, nontender Other:  No evidence of significant head trauma. Normal range of motion of all extremities, cranial nerves II through XII are normal.,  Reassuring neuroexam   ED Results / Procedures / Treatments   Labs (all labs ordered are listed, but only abnormal results are displayed) Labs Reviewed  CBC - Abnormal; Notable for the following components:      Result Value   MCH 25.6 (*)    RDW 15.9  (*)    All other components within normal limits  BASIC METABOLIC PANEL - Abnormal; Notable for the following components:   Glucose, Bld 109 (*)    Creatinine, Ser 0.60 (*)    Calcium 8.7 (*)    All other components within normal limits     EKG  ED ECG REPORT I, Lavonia Drafts, the attending physician, personally viewed and interpreted this ECG.  Date: 02/27/2023  Rhythm: normal sinus rhythm QRS Axis: normal Intervals: normal ST/T Wave abnormalities: normal Narrative Interpretation: no evidence of acute ischemia    RADIOLOGY CT head viewed interpreted by me, no acute abnormality    PROCEDURES:  Critical Care performed:   Procedures   MEDICATIONS ORDERED IN ED: Medications - No data to display   IMPRESSION / MDM / [place] / ED COURSE  I reviewed the triage vital signs and the nursing notes. Patient's presentation is most consistent with exacerbation of chronic illness.   Patient presents with primary complaint of high blood pressure.  Neurologically intact,  no complaints of chest pain or shortness of breath.  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  He is in police custody, reports that he felt dizzy briefly and fell out of  wheelchair in emergency department.  No evidence of significant injury on exam however he does think that he hit his head, will send for CT head  Neurologically intact, CBC, BMP are unremarkable.  Pending EKG, head CT  EKG is reassuring, CT head is unremarkable  Patient is well-appearing in no acute distress, appropriate for discharge with outpatient follow-up for blood pressure recheck.  Continue antihypertensives       FINAL CLINICAL IMPRESSION(S) / ED DIAGNOSES   Final diagnoses:  None     Rx / DC Orders   ED Discharge Orders     None        Note:  This document was prepared using Dragon voice recognition software and may include unintentional  dictation errors.   Lavonia Drafts, MD 02/27/23 1236

## 2023-02-27 NOTE — ED Triage Notes (Signed)
Pt via [employer], under police custody. Pt here for medical clearance for jail, states that his BP was 180 SBP. Pt states that he woke up this AM with R arm numbness, been out of his medication for the the past couple of weeks. Denies CP. States that he is having leg and knee pain also. Pt is A&OX4 and NAD

## 2023-02-27 NOTE — ED Notes (Signed)
Pt denies any ETOH or drug use today. Pt states the last time he did cocaine was a couple of days ago.
